# Patient Record
Sex: Female | Born: 1975 | Race: Black or African American | Hispanic: No | Marital: Married | Smoking: Never smoker
Health system: Southern US, Community
[De-identification: ages and names within clinical notes are randomized; demographics above are authoritative.]

## PROBLEM LIST (undated history)

## (undated) DIAGNOSIS — J45909 Unspecified asthma, uncomplicated: Secondary | ICD-10-CM

## (undated) DIAGNOSIS — E039 Hypothyroidism, unspecified: Secondary | ICD-10-CM

## (undated) HISTORY — PX: THYROIDECTOMY: SHX17

## (undated) HISTORY — PX: TUBAL LIGATION: SHX77

## (undated) HISTORY — PX: APPENDECTOMY: SHX54

---

## 2010-09-02 ENCOUNTER — Emergency Department: Payer: Self-pay | Admitting: Unknown Physician Specialty

## 2010-12-27 ENCOUNTER — Emergency Department: Payer: Self-pay | Admitting: Emergency Medicine

## 2012-12-31 ENCOUNTER — Emergency Department: Payer: Self-pay | Admitting: Emergency Medicine

## 2014-01-03 ENCOUNTER — Observation Stay: Payer: Self-pay | Admitting: Surgery

## 2014-01-03 LAB — CBC WITH DIFFERENTIAL/PLATELET
Basophil #: 0.1 10*3/uL (ref 0.0–0.1)
Basophil %: 0.9 %
Eosinophil #: 0.2 10*3/uL (ref 0.0–0.7)
Eosinophil %: 1.7 %
HCT: 36.2 % (ref 35.0–47.0)
HGB: 12 g/dL (ref 12.0–16.0)
Lymphocyte #: 3.3 10*3/uL (ref 1.0–3.6)
Lymphocyte %: 33.8 %
MCH: 30.8 pg (ref 26.0–34.0)
MCHC: 33.2 g/dL (ref 32.0–36.0)
MCV: 93 fL (ref 80–100)
MONOS PCT: 8.4 %
Monocyte #: 0.8 x10 3/mm (ref 0.2–0.9)
NEUTROS ABS: 5.4 10*3/uL (ref 1.4–6.5)
NEUTROS PCT: 55.2 %
PLATELETS: 306 10*3/uL (ref 150–440)
RBC: 3.9 10*6/uL (ref 3.80–5.20)
RDW: 13.2 % (ref 11.5–14.5)
WBC: 9.8 10*3/uL (ref 3.6–11.0)

## 2014-01-03 LAB — URINALYSIS, COMPLETE
BLOOD: NEGATIVE
Bilirubin,UR: NEGATIVE
Glucose,UR: NEGATIVE mg/dL (ref 0–75)
KETONE: NEGATIVE
Leukocyte Esterase: NEGATIVE
Nitrite: NEGATIVE
PH: 7 (ref 4.5–8.0)
Protein: NEGATIVE
RBC,UR: 2 /HPF (ref 0–5)
SPECIFIC GRAVITY: 1.021 (ref 1.003–1.030)
Squamous Epithelial: <1
WBC UR: NONE SEEN /HPF (ref 0–5)

## 2014-01-03 LAB — COMPREHENSIVE METABOLIC PANEL
ALBUMIN: 3.2 g/dL — AB (ref 3.4–5.0)
ANION GAP: 8 (ref 7–16)
Alkaline Phosphatase: 84 U/L
BUN: 14 mg/dL (ref 7–18)
Bilirubin,Total: 0.3 mg/dL (ref 0.2–1.0)
CALCIUM: 8 mg/dL — AB (ref 8.5–10.1)
CHLORIDE: 104 mmol/L (ref 98–107)
CO2: 28 mmol/L (ref 21–32)
Creatinine: 0.99 mg/dL (ref 0.60–1.30)
EGFR (African American): >60
Glucose: 97 mg/dL (ref 65–99)
Osmolality: 280 (ref 275–301)
Potassium: 3.7 mmol/L (ref 3.5–5.1)
SGOT(AST): 20 U/L (ref 15–37)
SGPT (ALT): 8 U/L — ABNORMAL LOW (ref 12–78)
Sodium: 140 mmol/L (ref 136–145)
Total Protein: 7.7 g/dL (ref 6.4–8.2)

## 2014-01-05 LAB — PATHOLOGY REPORT

## 2014-11-10 NOTE — Discharge Summary (Signed)
PATIENT NAME:  Marie LighterFRAZIER, [name]:  [id number] DATE OF BIRTH:  1976/03/28  DATE OF ADMISSION:  01/03/2014 DATE OF DISCHARGE:  01/05/2014  BRIEF HISTORY AND HOSPITAL COURSE: Marie LighterLaketha Gulino is a 39 year old woman admitted through the Emergency Room with clinical presentation and CT evidence consistent with acute appendicitis. After appropriate preoperative preparation and informed consent, she was taken to surgery on the morning of 01/03/2014. She underwent a laparoscopic appendectomy. The procedure was uncomplicated. She did have a bulbous tip to her appendix, but the pathology is not available at the time of discharge. Surgery was otherwise uncomplicated. She had some mild pain control issues following surgery, but this morning, is up, active, tolerating a diet with no complaints. Her wounds look good. There is no sign of any infection. Will discharge her home this morning to be followed in the office in 7 to 10 days' time. Bathing, activity and driving instructions were given to the patient.   DISCHARGE MEDICATIONS: She is to resume her home medications, which include levothyroxine 0.125 mg once a day. She will be discharged home on Norco 5/325 every 4 to 6 hours p.r.n.   FINAL DISCHARGE DIAGNOSIS: Acute appendicitis.   SURGERY: Laparoscopic appendectomy.   ____________________________ Quentin Orealph L. Ely III, MD rle:lb D: 01/05/2014 08:05:59 ET T: 01/05/2014 09:33:20 ET JOB#: [id number]  cc: Quentin Orealph L. Ely III, MD, <Dictator> Quentin OreALPH L ELY MD ELECTRONICALLY SIGNED 01/05/2014 18:18

## 2014-11-10 NOTE — Op Note (Signed)
PATIENT NAME:  [name], Marie MR#:  [id number] DATE OF BIRTH:  April 22, 1976  DATE OF PROCEDURE:  01/03/2014  PREOPERATIVE DIAGNOSIS:  Acute appendicitis.  POSTOPERATIVE DIAGNOSIS: Acute appendicitis with appendiceal mass.   SURGERY: Appendectomy.  ANESTHESIA: General.   SURGEON: Dr. Michela PitcherEly.   OPERATIVE PROCEDURE: With the patient in the supine position and after the induction of appropriate general anesthesia, the patient's abdomen was prepped with ChloraPrep and draped with sterile towels. A transverse midline incision was made in the site of her previous surgery, carried down through the subcutaneous tissue with Bovie electrocautery. A Veress needle was used to cannulate the peritoneal cavity. CO2 was insufflated to appropriate pressure measurements. When approximately 2.5 liters of CO2 were instilled, the Veress needle was withdrawn. An 11 mm medical port was inserted into the peritoneal cavity. Intraperitoneal position was confirmed. CO2 was reinsufflated. The appendix was easily visualized. Appeared to be adherent to the structures surrounding it. The base did not appear to be involved. A mid-epigastric, transverse incision was made, and 11 mm port inserted under direct vision. Left lower quadrant transverse incision was made, then the 12 mm port inserted under direct vision.  The camera was moved to the upper port and dissection carried out through the two lower ports. The appendix appeared to be inflamed and injected with a bulbous tip. Base of the appendix was dissected free of the cecum and the mesoappendix, and divided with a single application of Endo GIA stapling device carrying a blue load. The mesoappendix divided in a similar fashion with a white load. The appendix was captured in Endo Catch apparatus.  Because of the bolus tip, it was extremely difficult to remove. It appeared to be a thickened, mass-like portion at the tip of the appendix consistent with a possible appendiceal mass. The  abdomen was then irrigated and desufflated.  Following closure of the midline and left lower quadrant incisions with suture passer and 0 Vicryl, skin was closed with 5-0 nylon. The area was infiltrated with 0.25% Marcaine for postoperative pain control. Sterile dressings were applied. The patient was returned to the recovery room, having tolerated the procedure well. Sponge, instrument, and needle counts were correct x 2 in the Operating Room.    ____________________________ Quentin Orealph L. Ely III, MD rle:ts D: 01/03/2014 14:48:59 ET T: 01/03/2014 15:31:58 ET JOB#: [id number]  cc: Quentin Orealph L. Ely III, MD, <Dictator> Quentin OreALPH L ELY MD ELECTRONICALLY SIGNED 01/05/2014 18:14

## 2014-11-10 NOTE — H&P (Signed)
   Subjective/Chief Complaint Abdominal pain   History of Present Illness Marie Jimenez is a pleasant 39 yo F with a history of chronic constipation (usually approx 1 bm per week) who presents with 4 days of diffuse abd pain which is worse in the RLQ.  She says that it began suddenly and is worsening.  Last BM today x 2 and was regular.  + nausea.  Worse with movement.  Last PO at 8 pm.  No sick contacts, no unusual ingestions.  Has never had this pain before.  Also feels bloated.   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   Review of Systems:  Subjective/Chief Complaint Abdominal pain, worse RLQ   Fever/Chills No   Cough No   Sputum No   Abdominal Pain Yes   Diarrhea No   Constipation Yes   Nausea/Vomiting Yes   SOB/DOE No   Chest Pain No   Dysuria No   Tolerating Diet No  Nauseated   Physical Exam:  GEN well developed, well nourished, no acute distress   HEENT pink conjunctivae, PERRL, good dentition   RESP normal resp effort  clear BS  no use of accessory muscles   CARD regular rate  no murmur  no thrills  No LE edema  no JVD   ABD positive tenderness  soft  normal BS  hypoactive BS  RLQ tenderness, rebound   SKIN normal to palpation, No rashes, No ulcers   NEURO cranial nerves intact, negative rigidity, negative tremor, follows commands, strength:, motor/sensory function intact   PSYCH A+O to time, place, person, good insight    Assessment/Admission Diagnosis Marie Jimenez is a pleasant 39 yo F who presents with diffuse abd pain, worse in RLQ.  Tender in RLQ.  WBC 10 but neutrophils 55%.  CT shows dilated appendix with ? soft tissue stranding.  Equivocal appendicitis.    Plan Admit for observation, serial abdominal exam, IV fluids.  Will defer to daytime surgeon Dr. Michela PitcherEly regarding possible appendectomy.   Electronic Signatures: Jarvis NewcomerLundquist, Christopher A (MD)  (Signed 17-Jun-15 04:29)  Authored: CHIEF COMPLAINT and HISTORY, PAST MEDICAL/SURGIAL HISTORY, ALLERGIES, FAMILY AND SOCIAL HISTORY, REVIEW OF SYSTEMS, PHYSICAL EXAM, ASSESSMENT AND PLAN   Last Updated: 17-Jun-15 04:29 by Jarvis NewcomerLundquist, Christopher A (MD)

## 2016-07-20 ENCOUNTER — Encounter: Payer: Self-pay | Admitting: Emergency Medicine

## 2016-07-20 DIAGNOSIS — E039 Hypothyroidism, unspecified: Secondary | ICD-10-CM | POA: Insufficient documentation

## 2016-07-20 DIAGNOSIS — Y999 Unspecified external cause status: Secondary | ICD-10-CM | POA: Diagnosis not present

## 2016-07-20 DIAGNOSIS — W268XXA Contact with other sharp object(s), not elsewhere classified, initial encounter: Secondary | ICD-10-CM | POA: Diagnosis not present

## 2016-07-20 DIAGNOSIS — S61211A Laceration without foreign body of left index finger without damage to nail, initial encounter: Secondary | ICD-10-CM | POA: Insufficient documentation

## 2016-07-20 DIAGNOSIS — Y9389 Activity, other specified: Secondary | ICD-10-CM | POA: Diagnosis not present

## 2016-07-20 DIAGNOSIS — Y929 Unspecified place or not applicable: Secondary | ICD-10-CM | POA: Insufficient documentation

## 2016-07-20 NOTE — ED Triage Notes (Signed)
Pt presents to ED with c/o laceration to left index finger. Pt states she cut it this morning around 9am while cutting ribs. Pt states she attempted to apply pressure to the finger and has kept it bandaged all day but the bleeding has continued. Does not take a blood thinner or aspirin daily.

## 2016-07-21 ENCOUNTER — Emergency Department
Admission: EM | Admit: 2016-07-21 | Discharge: 2016-07-21 | Disposition: A | Discharge status: Home / Self Care | Attending: Emergency Medicine | Admitting: Emergency Medicine

## 2016-07-21 DIAGNOSIS — S61211A Laceration without foreign body of left index finger without damage to nail, initial encounter: Secondary | ICD-10-CM

## 2016-07-21 HISTORY — DX: Hypothyroidism, unspecified: E03.9

## 2016-07-21 NOTE — ED Notes (Signed)
Xeroform and dressing applied to LEFT index finger, Pt tolerated well.

## 2016-07-21 NOTE — Discharge Instructions (Signed)
Keep wound clean and dry.  Return to the ER for worsening symptoms, increased redness/swelling, purulent discharge or other concerns. 

## 2016-07-21 NOTE — ED Provider Notes (Signed)
[facility] Emergency Department Provider Note   ____________________________________________   First MD Initiated Contact with Patient 07/21/16 [id number]     (approximate)  I have reviewed the triage vital signs and the nursing notes.   HISTORY  Chief Complaint Laceration    HPI Marie Jimenez is a 41 y.o. female who presents to the ED from home with a chief complaint of left index finger laceration. Patient reports she cut her finger yesterday morning at 9 AM while cutting ribs. Tetanus is up-to-date. Presents to the ED due to persistent and intermittent bleeding throughout the day and night. Voices no other medical complaints.   Past Medical History:  Diagnosis Date  . Hypothyroid     There are no active problems to display for this patient.   Past Surgical History:  Procedure Laterality Date  . THYROIDECTOMY    . TUBAL LIGATION      Prior to Admission medications   Not on File    Allergies Amoxicillin and Shellfish allergy  No family history on file.  Social History Social History  Substance Use Topics  . Smoking status: Never Smoker  . Smokeless tobacco: Never Used  . Alcohol use Yes    Review of Systems  Constitutional: No fever/chills. Eyes: No visual changes. ENT: No sore throat. Cardiovascular: Denies chest pain. Respiratory: Denies shortness of breath. Gastrointestinal: No abdominal pain.  No nausea, no vomiting.  No diarrhea.  No constipation. Genitourinary: Negative for dysuria. Musculoskeletal: Positive for left index finger laceration. Negative for back pain. Skin: Negative for rash. Neurological: Negative for headaches, focal weakness or numbness.  10-point ROS otherwise negative.  ____________________________________________   PHYSICAL EXAM:  VITAL SIGNS: ED Triage Vitals  Enc Vitals Group     BP 07/20/16 2326 128/75     Pulse Rate 07/20/16 2326 78     Resp 07/20/16 2326 18     Temp 07/20/16 2326  98.2 F (36.8 C)     Temp Source 07/20/16 2326 Oral     SpO2 07/20/16 2326 100 %     Weight 07/20/16 2326 200 lb (90.7 kg)     Height 07/20/16 2326 5\' 10"  (1.778 m)     Head Circumference --      Peak Flow --      Pain Score 07/20/16 2327 7     Pain Loc --      Pain Edu? --      Excl. in GC? --     Constitutional: Alert and oriented. Well appearing and in no acute distress. Eyes: Conjunctivae are normal. PERRL. EOMI. Head: Atraumatic. Nose: No congestion/rhinnorhea. Mouth/Throat: Mucous membranes are moist.  Oropharynx non-erythematous. Neck: No stridor.   Cardiovascular: Normal rate, regular rhythm. Grossly normal heart sounds.  Good peripheral circulation. Respiratory: Normal respiratory effort.  No retractions. Lungs CTAB. Gastrointestinal: Soft and nontender. No distention. No abdominal bruits. No CVA tenderness. Musculoskeletal:  Left dorsal index finger with superficial paper cut-like laceration to proximal finger above the IP joints. There is no active bleeding. Wound is well approximated. 2+ radial pulses. Brisk, less than 5 second capillary refill. Neurologic:  Normal speech and language. No gross focal neurologic deficits are appreciated. No gait instability. Skin:  Skin is warm, dry and intact. No rash noted. Psychiatric: Mood and affect are normal. Speech and behavior are normal.  ____________________________________________   LABS (all labs ordered are listed, but only abnormal results are displayed)  Labs Reviewed - No data to display ____________________________________________  EKG  None  ____________________________________________  RADIOLOGY  None ____________________________________________   PROCEDURES  Procedure(s) performed: None  Procedures  Critical Care performed: No  ____________________________________________   INITIAL IMPRESSION / ASSESSMENT AND PLAN / ED COURSE  Pertinent labs & imaging results that were available during my  care of the patient were reviewed by me and considered in my medical decision making (see chart for details).  41 year old female who presents with superficial, nonbleeding laceration to his dorsal left index finger. Xeroform gauze and dressing applied by nurse. Strict return precautions given. Patient verbalizes understanding and agrees with plan of care.  Clinical Course      ____________________________________________   FINAL CLINICAL IMPRESSION(S) / ED DIAGNOSES  Final diagnoses:  Laceration of left index finger without foreign body without damage to nail, initial encounter      NEW MEDICATIONS STARTED DURING THIS VISIT:  New Prescriptions   No medications on file     Note:  This document was prepared using Dragon voice recognition software and may include unintentional dictation errors.    Irean HongJade J Hue Steveson, MD 07/21/16 [phone]

## 2016-07-21 NOTE — ED Notes (Signed)

## 2018-05-03 ENCOUNTER — Emergency Department (HOSPITAL_COMMUNITY)
Admission: EM | Admit: 2018-05-03 | Discharge: 2018-05-03 | Disposition: A | Discharge status: Home / Self Care | Attending: Emergency Medicine | Admitting: Emergency Medicine

## 2018-05-03 ENCOUNTER — Other Ambulatory Visit: Payer: Self-pay

## 2018-05-03 ENCOUNTER — Emergency Department (HOSPITAL_COMMUNITY)

## 2018-05-03 ENCOUNTER — Encounter (HOSPITAL_COMMUNITY): Payer: Self-pay | Admitting: *Deleted

## 2018-05-03 DIAGNOSIS — R55 Syncope and collapse: Secondary | ICD-10-CM

## 2018-05-03 DIAGNOSIS — R079 Chest pain, unspecified: Secondary | ICD-10-CM | POA: Diagnosis not present

## 2018-05-03 DIAGNOSIS — R002 Palpitations: Secondary | ICD-10-CM

## 2018-05-03 HISTORY — DX: Unspecified asthma, uncomplicated: J45.909

## 2018-05-03 LAB — CBC
HEMATOCRIT: 36.3 % (ref 36.0–46.0)
HEMOGLOBIN: 11.8 g/dL — AB (ref 12.0–15.0)
MCH: 30.5 pg (ref 26.0–34.0)
MCHC: 32.5 g/dL (ref 30.0–36.0)
MCV: 93.8 fL (ref 80.0–100.0)
NRBC: 0 % (ref 0.0–0.2)
Platelets: 262 10*3/uL (ref 150–400)
RBC: 3.87 MIL/uL (ref 3.87–5.11)
RDW: 12.3 % (ref 11.5–15.5)
WBC: 4.1 10*3/uL (ref 4.0–10.5)

## 2018-05-03 LAB — I-STAT BETA HCG BLOOD, ED (MC, WL, AP ONLY)

## 2018-05-03 LAB — BASIC METABOLIC PANEL
ANION GAP: 11 (ref 5–15)
BUN: 10 mg/dL (ref 6–20)
CHLORIDE: 101 mmol/L (ref 98–111)
CO2: 24 mmol/L (ref 22–32)
CREATININE: 0.91 mg/dL (ref 0.44–1.00)
Calcium: 9.7 mg/dL (ref 8.9–10.3)
GFR calc non Af Amer: >60 mL/min (ref >60)
Glucose, Bld: 95 mg/dL (ref 70–99)
POTASSIUM: 3.8 mmol/L (ref 3.5–5.1)
SODIUM: 136 mmol/L (ref 135–145)

## 2018-05-03 LAB — I-STAT TROPONIN, ED: Troponin i, poc: 0 ng/mL (ref 0.00–0.08)

## 2018-05-03 LAB — D-DIMER, QUANTITATIVE: D-Dimer, Quant: 0.27 ug/mL-FEU (ref 0.00–0.50)

## 2018-05-03 MED ORDER — LACTATED RINGERS IV BOLUS
1000.0000 mL | Freq: Once | INTRAVENOUS | Status: AC
  Administered 2018-05-03: 1000 mL via INTRAVENOUS

## 2018-05-03 MED ORDER — LORAZEPAM 2 MG/ML IJ SOLN
1.0000 mg | Freq: Once | INTRAMUSCULAR | Status: AC
  Administered 2018-05-03: 1 mg via INTRAVENOUS
  Filled 2018-05-03: qty 1

## 2018-05-03 NOTE — ED Triage Notes (Signed)
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 Pt used two puffs of her inhaler without improvement. Pt tachypneic, lung sounds are clear. Hx of hypothyroidism and asthma. Denies recent travel at present.

## 2018-05-03 NOTE — ED Notes (Signed)
Patient transported to X-ray 

## 2018-05-03 NOTE — ED Provider Notes (Signed)
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 Used inhaler 2 puffs, with no relief. States currently having left sided chest pain and sob.  ROS: SOB, chest pain, diziness  Physical Exam:   Gen: No distress  Neuro: Awake and Alert  Skin: Warm    Focused Exam:anxious appearing, hyperventilating. Breathing normal when distracted. Lungs clear to ausculation, regular HR and rhythm.   Vitals:   05/03/18 1916  BP: 128/85  Pulse: 68  Resp: 18  Temp: 98.6 F (37 C)  TempSrc: Oral  SpO2: 100%    ECG looks OK. Will get labs, CXR. Vitals are normal.   Initiation of care has begun. The patient has been counseled on the process, plan, and necessity for staying for the completion/evaluation, and the remainder of the medical screening examination    Jaynie Crumble, PA-C 05/03/18 1931    Little, Ambrose Finland, MD 05/04/18 1421

## 2018-05-03 NOTE — ED Notes (Signed)
Pt ambulatory to bathroom with no reported issues. 

## 2018-05-03 NOTE — ED Notes (Signed)
Reviewed d/c instructions with pt, who verbalized understanding and had no outstanding questions. Pt departed in NAD, refused use of wheelchair.   

## 2018-07-22 ENCOUNTER — Other Ambulatory Visit: Payer: Self-pay

## 2018-07-22 ENCOUNTER — Emergency Department
Admission: EM | Admit: 2018-07-22 | Discharge: 2018-07-23 | Disposition: A | Discharge status: Home / Self Care | Attending: Emergency Medicine | Admitting: Emergency Medicine

## 2018-07-22 DIAGNOSIS — L509 Urticaria, unspecified: Secondary | ICD-10-CM

## 2018-07-22 DIAGNOSIS — E039 Hypothyroidism, unspecified: Secondary | ICD-10-CM | POA: Insufficient documentation

## 2018-07-22 DIAGNOSIS — J45909 Unspecified asthma, uncomplicated: Secondary | ICD-10-CM | POA: Insufficient documentation

## 2018-07-22 DIAGNOSIS — T7840XA Allergy, unspecified, initial encounter: Secondary | ICD-10-CM | POA: Diagnosis not present

## 2018-07-22 DIAGNOSIS — Z79899 Other long term (current) drug therapy: Secondary | ICD-10-CM | POA: Diagnosis not present

## 2018-07-22 LAB — CBC WITH DIFFERENTIAL/PLATELET
Abs Immature Granulocytes: 0.03 10*3/uL (ref 0.00–0.07)
BASOS ABS: 0 10*3/uL (ref 0.0–0.1)
BASOS PCT: 0 %
EOS ABS: 0.1 10*3/uL (ref 0.0–0.5)
EOS PCT: 2 %
HCT: 37.8 % (ref 36.0–46.0)
Hemoglobin: 12.4 g/dL (ref 12.0–15.0)
Immature Granulocytes: 0 %
LYMPHS PCT: 51 %
Lymphs Abs: 3.7 10*3/uL (ref 0.7–4.0)
MCH: 30.4 pg (ref 26.0–34.0)
MCHC: 32.8 g/dL (ref 30.0–36.0)
MCV: 92.6 fL (ref 80.0–100.0)
MONO ABS: 0.6 10*3/uL (ref 0.1–1.0)
Monocytes Relative: 8 %
NRBC: 0 % (ref 0.0–0.2)
Neutro Abs: 2.9 10*3/uL (ref 1.7–7.7)
Neutrophils Relative %: 39 %
PLATELETS: 332 10*3/uL (ref 150–400)
RBC: 4.08 MIL/uL (ref 3.87–5.11)
RDW: 12.3 % (ref 11.5–15.5)
WBC: 7.5 10*3/uL (ref 4.0–10.5)

## 2018-07-22 LAB — COMPREHENSIVE METABOLIC PANEL
ALBUMIN: 4.3 g/dL (ref 3.5–5.0)
ALT: 12 U/L (ref 0–44)
ANION GAP: 7 (ref 5–15)
AST: 21 U/L (ref 15–41)
Alkaline Phosphatase: 49 U/L (ref 38–126)
BUN: 21 mg/dL — ABNORMAL HIGH (ref 6–20)
CALCIUM: 8.3 mg/dL — AB (ref 8.9–10.3)
CO2: 24 mmol/L (ref 22–32)
Chloride: 107 mmol/L (ref 98–111)
Creatinine, Ser: 0.91 mg/dL (ref 0.44–1.00)
GFR calc non Af Amer: >60 mL/min (ref >60)
GLUCOSE: 94 mg/dL (ref 70–99)
POTASSIUM: 3.2 mmol/L — AB (ref 3.5–5.1)
SODIUM: 138 mmol/L (ref 135–145)
TOTAL PROTEIN: 7.7 g/dL (ref 6.5–8.1)
Total Bilirubin: 0.5 mg/dL (ref 0.3–1.2)

## 2018-07-22 MED ORDER — EPINEPHRINE 0.3 MG/0.3ML IJ SOAJ: 0.3000 mg | Freq: Once | INTRAMUSCULAR | 1 refills | Status: AC

## 2018-07-22 MED ORDER — POTASSIUM CHLORIDE CRYS ER 20 MEQ PO TBCR
40.0000 meq | EXTENDED_RELEASE_TABLET | Freq: Once | ORAL | Status: AC
  Administered 2018-07-22: 40 meq via ORAL
  Filled 2018-07-22: qty 2

## 2018-07-22 MED ORDER — PREDNISONE 10 MG (21) PO TBPK: ORAL_TABLET | ORAL | 0 refills | Status: DC

## 2018-07-22 MED ORDER — HYDROXYZINE HCL 25 MG PO TABS
25.0000 mg | ORAL_TABLET | Freq: Once | ORAL | Status: AC
  Administered 2018-07-22: 25 mg via ORAL
  Filled 2018-07-22: qty 1

## 2018-07-22 MED ORDER — FAMOTIDINE IN NACL 20-0.9 MG/50ML-% IV SOLN
20.0000 mg | Freq: Once | INTRAVENOUS | Status: AC
  Administered 2018-07-22: 20 mg via INTRAVENOUS
  Filled 2018-07-22: qty 50

## 2018-07-22 MED ORDER — METHYLPREDNISOLONE SODIUM SUCC 125 MG IJ SOLR
125.0000 mg | Freq: Once | INTRAMUSCULAR | Status: AC
  Administered 2018-07-22: 125 mg via INTRAVENOUS
  Filled 2018-07-22: qty 2

## 2018-07-22 MED ORDER — DIPHENHYDRAMINE HCL 50 MG/ML IJ SOLN
50.0000 mg | Freq: Once | INTRAMUSCULAR | Status: AC
  Administered 2018-07-22: 50 mg via INTRAVENOUS
  Filled 2018-07-22: qty 1

## 2018-07-22 NOTE — ED Triage Notes (Signed)
Pt in with co hives all over states has had intermittent symptoms for a week. Went to urgent care a few days ago and got IM steroids and benadryl. States today feels worse, now feels like throat is "tight".

## 2018-07-22 NOTE — Discharge Instructions (Signed)
Please seek medical attention for any high fevers, chest pain, shortness of breath, change in behavior, persistent vomiting, bloody stool or any other new or concerning symptoms.  

## 2018-07-22 NOTE — ED Provider Notes (Signed)
[facility] Emergency Department Provider Note    ____________________________________________   I have reviewed the triage vital signs and the nursing notes.   HISTORY  Chief Complaint Allergic Reaction   History limited by: Not Limited   HPI Marie Jimenez is a 43 y.o. female who presents to the emergency department today because of concerns for continued and somewhat recurrent hives.  Patient states she first had a hives 4 days ago.  She is unsure what her allergen exposure might have been but she does describe diffuse hives and itchiness.  She went to walk-in clinic at that time was given a shot of steroids and Benadryl and stated that the next day she did feel better.  The hives however returned.  She states today she is also feeling a slight bit of throat tightness when she goes to swallow.  She has not had any nausea or vomiting.  She states she has a history of asthma.  She denies any recent illnesses or fevers.   Per medical record review patient has a history of asthma.  Past Medical History:  Diagnosis Date  . Asthma   . Hypothyroid     There are no active problems to display for this patient.   Past Surgical History:  Procedure Laterality Date  . THYROIDECTOMY    . TUBAL LIGATION      Prior to Admission medications   Medication Sig Start Date End Date Taking? Authorizing Provider  levothyroxine (SYNTHROID, LEVOTHROID) 112 MCG tablet Take 112 mcg by mouth daily. 04/19/18   [provider]  phentermine (ADIPEX-P) 37.5 MG tablet Take 37.5 mg by mouth daily. 02/15/18   [provider]    Allergies Amoxicillin and Shellfish allergy  No family history on file.  Social History Social History   Tobacco Use  . Smoking status: Never Smoker  . Smokeless tobacco: Never Used  Substance Use Topics  . Alcohol use: Yes  . Drug use: No    Review of Systems Constitutional: No fever/chills Eyes: No visual changes. ENT:  Positive for slight tightness with swallowing.  Cardiovascular: Denies chest pain. Respiratory: Denies shortness of breath. Gastrointestinal: No abdominal pain.  No nausea, no vomiting.  No diarrhea.   Genitourinary: Negative for dysuria. Musculoskeletal: Negative for back pain. Skin: Positive for hives. Neurological: Negative for headaches, focal weakness or numbness.  ____________________________________________   PHYSICAL EXAM:  VITAL SIGNS: ED Triage Vitals [07/22/18 2203]  Enc Vitals Group     BP (!) 147/79     Pulse Rate 76     Resp 20     Temp 98.2 F (36.8 C)     Temp Source Oral     SpO2 100 %     Weight 215 lb (97.5 kg)     Height      Head Circumference      Peak Flow      Pain Score 0   Constitutional: Alert and oriented.  Eyes: Conjunctivae are normal.  ENT      Head: Normocephalic and atraumatic.      Nose: No congestion/rhinnorhea.      Mouth/Throat: Mucous membranes are moist.      Neck: No stridor. Hematological/Lymphatic/Immunilogical: No cervical lymphadenopathy. Cardiovascular: Normal rate, regular rhythm.  No murmurs, rubs, or gallops.  Respiratory: Normal respiratory effort without tachypnea nor retractions. Breath sounds are clear and equal bilaterally. No wheezes/rales/rhonchi. Gastrointestinal: Soft and non tender. No rebound. No guarding.  Genitourinary: Deferred Musculoskeletal: Normal range of motion in all  extremities. No lower extremity edema. Neurologic:  Normal speech and language. No gross focal neurologic deficits are appreciated.  Skin:  Small amount of hives on forearms.  Psychiatric: Mood and affect are normal. Speech and behavior are normal. Patient exhibits appropriate insight and judgment.  ____________________________________________    LABS (pertinent positives/negatives)  CBC wbc 7.5, hgb 12.4, plt 332 CMP na 138, k 3.2, glu 94, cr  0.91  ____________________________________________   EKG  None  ____________________________________________    RADIOLOGY  None   ____________________________________________   PROCEDURES  Procedures  ____________________________________________   INITIAL IMPRESSION / ASSESSMENT AND PLAN / ED COURSE  Pertinent labs & imaging results that were available during my care of the patient were reviewed by me and considered in my medical decision making (see chart for details).   Patient presented to the emergency department today because of concerns for continued hives and itchiness.  On exam patient does have some hives of the forearms.  Patient no respiratory distress.  Patient will be given IV Benadryl and Solu-Medrol.  Will check some blood work to make sure that we do not see any concerning findings.  Blood work shows very minimally low potassium but otherwise unremarkable.  Patient still has some hives so will add on Pepcid as well as Atarax.   ____________________________________________   FINAL CLINICAL IMPRESSION(S) / ED DIAGNOSES  Final diagnoses:  Allergic reaction, initial encounter  Hives     Note: This dictation was prepared with Dragon dictation. Any transcriptional errors that result from this process are unintentional     Phineas SemenGoodman, Choua Ikner, MD 07/22/18 2330

## 2018-07-23 NOTE — ED Notes (Signed)
Peripheral IV discontinued. Catheter intact. No signs of infiltration or redness. Gauze applied to IV site.   Discharge instructions reviewed with patient. Questions fielded by this RN. Patient verbalizes understanding of instructions. Patient discharged home in stable condition per [place]. No acute distress noted at time of discharge.   

## 2018-08-02 ENCOUNTER — Ambulatory Visit (INDEPENDENT_AMBULATORY_CARE_PROVIDER_SITE_OTHER): Admitting: Allergy and Immunology

## 2018-08-02 ENCOUNTER — Encounter: Payer: Self-pay | Admitting: Allergy and Immunology

## 2018-08-02 VITALS — BP 124/80 | HR 88 | Temp 98.9°F | Resp 18 | Ht 70.0 in | Wt 225.0 lb

## 2018-08-02 DIAGNOSIS — T7800XD Anaphylactic reaction due to unspecified food, subsequent encounter: Secondary | ICD-10-CM

## 2018-08-02 DIAGNOSIS — L500 Allergic urticaria: Secondary | ICD-10-CM

## 2018-08-02 DIAGNOSIS — T7800XA Anaphylactic reaction due to unspecified food, initial encounter: Secondary | ICD-10-CM | POA: Insufficient documentation

## 2018-08-02 DIAGNOSIS — J4520 Mild intermittent asthma, uncomplicated: Secondary | ICD-10-CM | POA: Insufficient documentation

## 2018-08-02 DIAGNOSIS — K2970 Gastritis, unspecified, without bleeding: Secondary | ICD-10-CM | POA: Diagnosis not present

## 2018-08-02 DIAGNOSIS — L501 Idiopathic urticaria: Secondary | ICD-10-CM | POA: Insufficient documentation

## 2018-08-02 MED ORDER — EPINEPHRINE 0.3 MG/0.3ML IJ SOAJ: 0.3000 mg | Freq: Once | INTRAMUSCULAR | 2 refills | Status: DC | PRN

## 2018-08-02 MED ORDER — MONTELUKAST SODIUM 10 MG PO TABS: 10.0000 mg | ORAL_TABLET | Freq: Every day | ORAL | 5 refills | Status: DC

## 2018-08-02 MED ORDER — FAMOTIDINE 20 MG PO TABS: 20.0000 mg | ORAL_TABLET | Freq: Two times a day (BID) | ORAL | 5 refills | Status: DC

## 2018-08-02 NOTE — Assessment & Plan Note (Signed)
   Continue albuterol HFA, 1 to 2 inhalations every 4-6 hours as needed and 15 minutes prior to exercise. 

## 2018-08-02 NOTE — Assessment & Plan Note (Addendum)
Unclear etiology. Skin tests to select food allergens were negative today. NSAIDs and emotional stress commonly exacerbate urticaria but are not the underlying etiology in this case. Physical urticarias are negative by history (i.e. pressure-induced, temperature, vibration, solar, etc.).  There are no concomitant symptoms concerning for anaphylaxis. We will rule out other potential etiologies with labs. For symptom relief, patient is to take oral antihistamines as directed.  The following labs have been ordered: FCeRI antibody, anti-thyroglobulin antibody, thyroid peroxidase antibody, tryptase, urea breath test, ESR, ANA, and galactose-alpha-1,3-galactose IgE level. CBC and CMP were recently checked so those labs will not be rechecked at this time.  The patient will be called with further recommendations after lab results have returned.  Instructions have been discussed and provided for H1/H2 receptor blockade with titration to find lowest effective dose.  A prescription has been provided for levocetirizine, 5 mg daily as needed.  A prescription has been provided for famotidine (Pepcid) 20 mg twice daily.  A prescription has been provided for montelukast 10 mg daily at bedtime.  Should there be a significant increase or change in symptoms, a journal is to be kept recording any foods eaten, beverages consumed, medications taken within a 6 hour period prior to the onset of symptoms, as well as record activities being performed, and environmental conditions. For any symptoms concerning for anaphylaxis, 911 is to be called immediately.

## 2018-08-02 NOTE — Progress Notes (Signed)
New Patient Note  RE: Marie Jimenez MRN: [id number] DOB: October 31, 1975 Date of Office Visit: 08/02/2018  Referring provider: Katherina Mires, MD Primary care provider: Katherina Mires, MD  Chief Complaint: Urticaria and Allergic Reaction   History of present illness: Marie Jimenez is a 43 y.o. female seen today in consultation requested by Suzanna Obey, MD.  Over the past 4 months, Marie Jimenez has experienced recurrent episodes of hives. The hives have appeared at different times over her entire body.  The lesions are described as erythematous, raised, and pruritic.  Individual hives last less than 24 hours without leaving residual pigmentation or bruising. She denies concomitant angioedema, cardiopulmonary symptoms and GI symptoms. She has not experienced unexpected weight loss, recurrent fevers or drenching night sweats. No specific medication, food, skin care product, detergent, soap, or other environmental triggers have been identified. The symptoms do not seem to correlate with NSAIDs use or emotional stress. She did not have symptoms consistent with a respiratory tract infection at the time of symptom onset. Marie Jimenez has tried to control symptoms with systemic steroids and OTC antihistamines which have offered adequate temporary relief.  However, the hives seem to return when she discontinues prednisone.  She has been evaluated and treated in the emergency department for these symptoms. Skin biopsy has not been performed. Recently, she has been experiencing hives a few times a week.  She reports that since childhood she has experienced generalized pruritus with vigorous exercise and sweating.  She does not believe that she developed urticaria during those times however. She reports that when she was 71 or 43 years old she consumed shellfish and fish and had a severe anaphylactic reaction requiring multiple day hospitalization.  She has carefully avoided shellfish and fish since that time and has access  to epinephrine autoinjectors. The patient states that she was diagnosed with asthma approximately 20 years ago.  She typically only needs albuterol rescue with exercise.  Assessment and plan: Recurrent urticaria Unclear etiology. Skin tests to select food allergens were negative today. NSAIDs and emotional stress commonly exacerbate urticaria but are not the underlying etiology in this case. Physical urticarias are negative by history (i.e. pressure-induced, temperature, vibration, solar, etc.).  There are no concomitant symptoms concerning for anaphylaxis. We will rule out other potential etiologies with labs. For symptom relief, patient is to take oral antihistamines as directed.  The following labs have been ordered: FCeRI antibody, anti-thyroglobulin antibody, thyroid peroxidase antibody, tryptase, urea breath test, ESR, ANA, and galactose-alpha-1,3-galactose IgE level. CBC and CMP were recently checked so those labs will not be rechecked at this time.  The patient will be called with further recommendations after lab results have returned.  Instructions have been discussed and provided for H1/H2 receptor blockade with titration to find lowest effective dose.  A prescription has been provided for levocetirizine, 5 mg daily as needed.  A prescription has been provided for famotidine (Pepcid) 20 mg twice daily.  A prescription has been provided for montelukast 10 mg daily at bedtime.  Should there be a significant increase or change in symptoms, a journal is to be kept recording any foods eaten, beverages consumed, medications taken within a 6 hour period prior to the onset of symptoms, as well as record activities being performed, and environmental conditions. For any symptoms concerning for anaphylaxis, 911 is to be called immediately.  Food allergy The patient's history suggests food allergy and positive skin test results today confirm this diagnosis.  Food allergen skin test revealed  reactivity to shellfish.  However, skin tests were negative to fish despite a positive histamine control.  Meticulous avoidance of shellfish as discussed.  A refill prescription has been provided for epinephrine auto-injector 2 pack along with instructions for proper administration.  A food allergy action plan has been provided and discussed.  Medic Alert identification is recommended.  A laboratory order form has been provided for serum specific IgE against fish panel.  Until fish allergy has been ruled out, continue avoidance of fish, in addition to shellfish, and have access to epinephrine autoinjectors.  Mild intermittent asthma  Continue albuterol HFA, 1 to 2 inhalations every 4-6 hours as needed and 15 minutes prior to exercise.   Meds ordered this encounter  Medications  . famotidine (PEPCID) 20 MG tablet    Sig: Take 1 tablet (20 mg total) by mouth 2 (two) times daily.    Dispense:  60 tablet    Refill:  5  . montelukast (SINGULAIR) 10 MG tablet    Sig: Take 1 tablet (10 mg total) by mouth at bedtime.    Dispense:  30 tablet    Refill:  5  . EPINEPHrine (EPIPEN 2-PAK) 0.3 mg/0.3 mL IJ SOAJ injection    Sig: Inject 0.3 mLs (0.3 mg total) into the muscle once as needed for up to 1 dose.    Dispense:  2 Device    Refill:  2    Diagnostics: Environmental skin testing: Negative despite a positive histamine control. Food allergen skin testing: Reactive to shrimp, crab, lobster, oyster, and scallops.    Physical examination: Blood pressure 124/80, pulse 88, temperature 98.9 F (37.2 C), temperature source Oral, resp. rate 18, height 5' 10"  (1.778 m), weight 225 lb (102.1 kg), last menstrual period 07/03/2018, SpO2 98 %.  General: Alert, interactive, in no acute distress. HEENT: TMs pearly gray, turbinates moderately edematous without discharge, post-pharynx unremarkable. Neck: Supple without lymphadenopathy. Lungs: Clear to auscultation without wheezing, rhonchi or  rales. CV: Normal S1, S2 without murmurs. Abdomen: Nondistended, nontender. Skin: Warm and dry, without lesions or rashes. Extremities:  No clubbing, cyanosis or edema. Neuro:   Grossly intact.  Review of systems:  Review of systems negative except as noted in HPI / PMHx or noted below: Review of Systems  Constitutional: Negative.   HENT: Negative.   Eyes: Negative.   Respiratory: Negative.   Cardiovascular: Negative.   Gastrointestinal: Negative.   Genitourinary: Negative.   Musculoskeletal: Negative.   Skin: Negative.   Neurological: Negative.   Endo/Heme/Allergies: Negative.   Psychiatric/Behavioral: Negative.     Past medical history:  Past Medical History:  Diagnosis Date  . Asthma   . Hypothyroid     Past surgical history:  Past Surgical History:  Procedure Laterality Date  . APPENDECTOMY    . THYROIDECTOMY    . TUBAL LIGATION      Family history: History reviewed. No pertinent family history.  Social history: Social History   Socioeconomic History  . Marital status: Married    Spouse name: Not on file  . Number of children: Not on file  . Years of education: Not on file  . Highest education level: Not on file  Occupational History  . Not on file  Social Needs  . Financial resource strain: Not on file  . Food insecurity:    Worry: Not on file    Inability: Not on file  . Transportation needs:    Medical: Not on file    Non-medical: Not on file  Tobacco Use  . Smoking status: Never Smoker  . Smokeless tobacco: Never Used  Substance and Sexual Activity  . Alcohol use: Yes  . Drug use: No  . Sexual activity: Not on file  Lifestyle  . Physical activity:    Days per week: Not on file    Minutes per session: Not on file  . Stress: Not on file  Relationships  . Social connections:    Talks on phone: Not on file    Gets together: Not on file    Attends religious service: Not on file    Active member of club or organization: Not on file     Attends meetings of clubs or organizations: Not on file    Relationship status: Not on file  . Intimate partner violence:    Fear of current or ex partner: Not on file    Emotionally abused: Not on file    Physically abused: Not on file    Forced sexual activity: Not on file  Other Topics Concern  . Not on file  Social History Narrative  . Not on file   Environmental History: The patient lives in a 42 year old house with carpeting in the bedroom and central air and window air conditioning units.  There is no known mold/water damage in the home.  She is a never smoker.  There are no pets in the home.  Allergies as of 08/02/2018      Reactions   Amoxicillin Swelling   Shellfish Allergy Swelling      Medication List       Accurate as of August 02, 2018  4:18 PM. Always use your most recent med list.        EPINEPHrine 0.3 mg/0.3 mL Soaj injection Commonly known as:  EPIPEN 2-PAK Inject 0.3 mLs (0.3 mg total) into the muscle once as needed for up to 1 dose.   famotidine 20 MG tablet Commonly known as:  PEPCID Take 1 tablet (20 mg total) by mouth 2 (two) times daily.   levothyroxine 112 MCG tablet Commonly known as:  SYNTHROID, LEVOTHROID Take 112 mcg by mouth daily.   montelukast 10 MG tablet Commonly known as:  SINGULAIR Take 1 tablet (10 mg total) by mouth at bedtime.       Known medication allergies: Allergies  Allergen Reactions  . Amoxicillin Swelling  . Shellfish Allergy Swelling    I appreciate the opportunity to take part in Marie Jimenez's care. Please do not hesitate to contact me with questions.  Sincerely,   R. Edgar Frisk, MD

## 2018-08-02 NOTE — Assessment & Plan Note (Addendum)
The patient's history suggests food allergy and positive skin test results today confirm this diagnosis.  Food allergen skin test revealed reactivity to shellfish.  However, skin tests were negative to fish despite a positive histamine control.  Meticulous avoidance of shellfish as discussed.  A refill prescription has been provided for epinephrine auto-injector 2 pack along with instructions for proper administration.  A food allergy action plan has been provided and discussed.  Medic Alert identification is recommended.  A laboratory order form has been provided for serum specific IgE against fish panel.  Until fish allergy has been ruled out, continue avoidance of fish, in addition to shellfish, and have access to epinephrine autoinjectors.

## 2018-08-02 NOTE — Patient Instructions (Addendum)
Recurrent urticaria Unclear etiology. Skin tests to select food allergens were negative today. NSAIDs and emotional stress commonly exacerbate urticaria but are not the underlying etiology in this case. Physical urticarias are negative by history (i.e. pressure-induced, temperature, vibration, solar, etc.).  There are no concomitant symptoms concerning for anaphylaxis. We will rule out other potential etiologies with labs. For symptom relief, patient is to take oral antihistamines as directed.  The following labs have been ordered: FCeRI antibody, anti-thyroglobulin antibody, thyroid peroxidase antibody, tryptase, urea breath test, ESR, ANA, and galactose-alpha-1,3-galactose IgE level. CBC and CMP were recently checked so those labs will not be rechecked at this time.  The patient will be called with further recommendations after lab results have returned.  Instructions have been discussed and provided for H1/H2 receptor blockade with titration to find lowest effective dose.  A prescription has been provided for levocetirizine, 5 mg daily as needed.  A prescription has been provided for famotidine (Pepcid) 20 mg twice daily.  A prescription has been provided for montelukast 10 mg daily at bedtime.  Should there be a significant increase or change in symptoms, a journal is to be kept recording any foods eaten, beverages consumed, medications taken within a 6 hour period prior to the onset of symptoms, as well as record activities being performed, and environmental conditions. For any symptoms concerning for anaphylaxis, 911 is to be called immediately.  Food allergy The patient's history suggests food allergy and positive skin test results today confirm this diagnosis.  Food allergen skin test revealed reactivity to shellfish.  However, skin tests were negative to fish despite a positive histamine control.  Meticulous avoidance of shellfish as discussed.  A refill prescription has been provided  for epinephrine auto-injector 2 pack along with instructions for proper administration.  A food allergy action plan has been provided and discussed.  Medic Alert identification is recommended.  A laboratory order form has been provided for serum specific IgE against fish panel.  Until fish allergy has been ruled out, continue avoidance of fish, in addition to shellfish, and have access to epinephrine autoinjectors.  Mild intermittent asthma  Continue albuterol HFA, 1 to 2 inhalations every 4-6 hours as needed and 15 minutes prior to exercise.   When lab results have returned the patient will be called with further recommendations and follow up instructions.   Urticaria (Hives)  . Levocetirizine (Xyzal) 5 mg twice a day and famotidine (Pepcid) 20 mg twice a day. If no symptoms for 7-14 days then decrease to. . Levocetirizine (Xyzal) 5 mg twice a day and famotidine (Pepcid) 20 mg once a day.  If no symptoms for 7-14 days then decrease to. . Levocetirizine (Xyzal) 5 mg twice a day.  If no symptoms for 7-14 days then decrease to. . Levocetirizine (Xyzal) 5 mg once a day.  May use Benadryl (diphenhydramine) as needed for breakthrough symptoms       If symptoms return, then step up dosage

## 2018-09-01 LAB — H. PYLORI BREATH TEST: H pylori Breath Test: NEGATIVE

## 2018-09-07 LAB — ALPHA-GAL PANEL
Alpha Gal IgE*: <0.1 kU/L (ref <0.10)
Beef (Bos spp) IgE: <0.1 kU/L (ref <0.35)
Class Interpretation: 0
Class Interpretation: 0
Class Interpretation: 0
Lamb/Mutton (Ovis spp) IgE: <0.1 kU/L (ref <0.35)
Pork (Sus spp) IgE: <0.1 kU/L (ref <0.35)

## 2018-09-07 LAB — CHRONIC URTICARIA: cu index: 3.2 (ref <10)

## 2018-09-07 LAB — ALLERGEN PROFILE, FOOD-FISH
Allergen Mackerel IgE: <0.1 kU/L
Allergen Salmon IgE: <0.1 kU/L
Allergen Trout IgE: <0.1 kU/L
Allergen Walley Pike IgE: <0.1 kU/L
Codfish IgE: <0.1 kU/L
Halibut IgE: <0.1 kU/L
Tuna: <0.1 kU/L

## 2018-09-07 LAB — ANA: Anti Nuclear Antibody(ANA): NEGATIVE

## 2018-09-07 LAB — THYROID ANTIBODIES
Thyroglobulin Antibody: <1 IU/mL (ref 0.0–0.9)
Thyroperoxidase Ab SerPl-aCnc: 9 IU/mL (ref 0–34)

## 2018-09-07 LAB — TRYPTASE: Tryptase: 6.4 ug/L (ref 2.2–13.2)

## 2018-09-07 LAB — SEDIMENTATION RATE: Sed Rate: 6 mm/hr (ref 0–32)

## 2018-11-01 ENCOUNTER — Encounter: Payer: Self-pay | Admitting: Allergy and Immunology

## 2018-11-01 ENCOUNTER — Other Ambulatory Visit: Payer: Self-pay

## 2018-11-01 ENCOUNTER — Telehealth: Payer: Self-pay | Admitting: *Deleted

## 2018-11-01 ENCOUNTER — Ambulatory Visit (INDEPENDENT_AMBULATORY_CARE_PROVIDER_SITE_OTHER): Admitting: Allergy and Immunology

## 2018-11-01 DIAGNOSIS — L501 Idiopathic urticaria: Secondary | ICD-10-CM | POA: Diagnosis not present

## 2018-11-01 DIAGNOSIS — T7800XD Anaphylactic reaction due to unspecified food, subsequent encounter: Secondary | ICD-10-CM

## 2018-11-01 DIAGNOSIS — J4520 Mild intermittent asthma, uncomplicated: Secondary | ICD-10-CM

## 2018-11-01 MED ORDER — OMALIZUMAB 150 MG ~~LOC~~ SOLR
300.0000 mg | SUBCUTANEOUS | Status: DC
  Administered 2018-11-01 – 2018-12-27 (×3): 300 mg via SUBCUTANEOUS

## 2018-11-01 NOTE — Telephone Encounter (Signed)
Got it will reach out to patient to discuss

## 2018-11-01 NOTE — Progress Notes (Signed)
Immunotherapy   Patient Details  Name: Marie Jimenez MRN: [id number] Date of Birth: 03-21-1976  11/01/2018  Gladstone Lighter started injections for  [name] Frequency: Every 4 Weeks  Epi-Pen: Yes Consent signed and patient instructions given.  Patient received a 300mg  sample of Xolair today in office. Patient waited 30 minutes and did not experience any issues.    Ashleigh Fernandez-Vernon 11/01/2018, 12:16 PM

## 2018-11-01 NOTE — Assessment & Plan Note (Signed)
   I have discussed omalizumab (Xolair) with the patient and she is motivated to start this therapy.  A sample will be provided today for her to come in and start therapy.  We will submit paperwork for omalizumab (Xolair) 300 mg every 4 weeks.  Continue H1/H2 receptor blockade, titrating to the lowest effective dose necessary to suppress urticaria.  For now, continue montelukast 10 mg daily.  Additional prednisone will be provided in case of breakthrough.  I have discussed the importance of tapering to the lowest effective dose of prednisone and Marie Jimenez has verbalized understanding and agreement.

## 2018-11-01 NOTE — Telephone Encounter (Signed)
Pt received 300mg  sample of Xolair today every 4 weeks for Chronic Idiopathic Urticaria per Dr. Nunzio Cobbs. An appointment has been made for 11/29/2018 for her next injection.

## 2018-11-01 NOTE — Patient Instructions (Signed)
Chronic idiopathic urticaria  I have discussed omalizumab (Xolair) with the patient and she is motivated to start this therapy.  A sample will be provided today for her to come in and start therapy.  We will submit paperwork for omalizumab (Xolair) 300 mg every 4 weeks.  Continue H1/H2 receptor blockade, titrating to the lowest effective dose necessary to suppress urticaria.  For now, continue montelukast 10 mg daily.  Additional prednisone will be provided in case of breakthrough.  I have discussed the importance of tapering to the lowest effective dose of prednisone and Marie Jimenez has verbalized understanding and agreement.  Mild intermittent asthma  Continue albuterol HFA, 1 to 2 inhalations every 4-6 hours as needed and 15 minutes prior to exercise.  Food allergy  Continue careful avoidance of seafood and have access to epinephrine autoinjector 2 pack in case of accidental ingestion.  Food allergy action plan is in place.   Return in about 3 months (around 01/31/2019), or if symptoms worsen or fail to improve.

## 2018-11-01 NOTE — Assessment & Plan Note (Signed)
   Continue albuterol HFA, 1 to 2 inhalations every 4-6 hours as needed and 15 minutes prior to exercise. 

## 2018-11-01 NOTE — Progress Notes (Signed)
Follow-up Telemedicine Note  RE: Marie Jimenez Kronberg MRN: [id number] DOB: 1976/04/30 Date of Telemedicine Visit: 11/01/2018  Primary care provider: Macy MisBriscoe, Kim K, MD Referring provider: Macy MisBriscoe, Kim K, MD  Telemedicine Follow Up Visit via Telephone: I connected with Marie Jimenez Zaffino for a follow up on 11/01/18 by telephone and verified that I am speaking with the correct person using two identifiers.   The limitations, risks, security and privacy concerns of performing an evaluation and management service by telemedicine, the availability of in person appointments, and that there may be a patient responsible charge related to this service were discussed. The patient expressed understanding and agreed to proceed.  Patient is at home.  Provider is at the office.  Visit start time: 8:43 am Visit end time: 9:10 am Insurance consent/check in by: Victorino DikeJennifer Medical consent and medical assistant/nurse: Ladona Ridgelaylor  History of present illness: Marie Jimenez Ebrahimi is a 43 y.o. female with chronic idiopathic urticaria, food allergy, and intermittent asthma presenting today via telephone for follow-up.  She is previously seen in this clinic for her initial evaluation on August 02, 2018.  Her skin test and lab work were unrevealing of an etiology for the recurrent urticaria.  She reports that from mid January to mid February she had required levocetirizine twice daily, famotidine twice daily, montelukast daily, as well as prednisone to suppress the urticaria.  Then she went off the prednisone for for approximately 10 days prior to having her labs drawn.  At that point, the urticaria was under good control with H1/H2 blockade and montelukast.  However, on April 1 the hives returned "everywhere".  Since that time, she has required prednisone daily along with the antihistamine regimen and montelukast to suppress the urticaria.  She typically requires 20 mg of prednisone 3 times a day. She reports that her asthma has  been well controlled.  She has only required albuterol rescue on one occasion in the interval since her previous visit.  On that occasion she had been strenuously exerting herself doing housework.  She does not experience limitations in normal daily activities or nocturnal awakenings due to lower respiratory symptoms. Verlene MayerLaketha avoids seafood and has access to epinephrine autoinjectors in case of accidental ingestion followed by systemic symptoms.  Assessment and plan: Chronic idiopathic urticaria  I have discussed omalizumab (Xolair) with the patient and she is motivated to start this therapy.  A sample will be provided today for her to come in and start therapy.  We will submit paperwork for omalizumab (Xolair) 300 mg every 4 weeks.  Continue H1/H2 receptor blockade, titrating to the lowest effective dose necessary to suppress urticaria.  For now, continue montelukast 10 mg daily.  Additional prednisone will be provided in case of breakthrough.  I have discussed the importance of tapering to the lowest effective dose of prednisone and Verlene MayerLaketha has verbalized understanding and agreement.  Mild intermittent asthma  Continue albuterol HFA, 1 to 2 inhalations every 4-6 hours as needed and 15 minutes prior to exercise.  Food allergy  Continue careful avoidance of seafood and have access to epinephrine autoinjector 2 pack in case of accidental ingestion.  Food allergy action plan is in place.   Meds ordered this encounter  Medications  . omalizumab [name](XOLAIR) injection 300 mg    Diagnostics: None.  Physical examination: Physical Exam Not obtained as encounter was done via telephone.   The following portions of the patient's history were reviewed and updated as appropriate: allergies, current medications, past family history, past medical history, past  social history, past surgical history and problem list.  Allergies as of 11/01/2018      Reactions   Amoxicillin Swelling   Shellfish  Allergy Swelling      Medication List       Accurate as of November 01, 2018 12:36 PM. Always use your most recent med list.        albuterol 108 (90 Base) MCG/ACT inhaler Commonly known as:  PROVENTIL HFA;VENTOLIN HFA Inhale into the lungs.   EPINEPHrine 0.3 mg/0.3 mL Soaj injection Commonly known as:  EpiPen 2-Pak Inject 0.3 mLs (0.3 mg total) into the muscle once as needed for up to 1 dose.   famotidine 20 MG tablet Commonly known as:  Pepcid Take 1 tablet (20 mg total) by mouth 2 (two) times daily.   hydrOXYzine 25 MG tablet Commonly known as:  ATARAX/VISTARIL   levocetirizine 5 MG tablet Commonly known as:  XYZAL   levothyroxine 112 MCG tablet Commonly known as:  SYNTHROID, LEVOTHROID Take 112 mcg by mouth daily.   montelukast 10 MG tablet Commonly known as:  SINGULAIR Take 1 tablet (10 mg total) by mouth at bedtime.   phentermine 37.5 MG tablet Commonly known as:  ADIPEX-P Take by mouth.   predniSONE 10 MG (21) Tbpk tablet Commonly known as:  STERAPRED UNI-PAK 21 TAB FPD   triamcinolone cream 0.5 % Commonly known as:  KENALOG APP EXT AA BID PRF RASH       Allergies  Allergen Reactions  . Amoxicillin Swelling  . Shellfish Allergy Swelling   Review of systems: Review of systems negative except as noted in HPI / PMHx or noted below: Constitutional: Negative.  HENT: Negative.   Eyes: Negative.  Respiratory: Negative.   Cardiovascular: Negative.  Gastrointestinal: Negative.  Genitourinary: Negative.  Musculoskeletal: Negative.  Neurological: Negative.  Endo/Heme/Allergies: Negative.  Cutaneous: Negative.  Past Medical History:  Diagnosis Date  . Asthma   . Hypothyroid     Family History  Problem Relation Age of Onset  . Allergic rhinitis Neg Hx   . Asthma Neg Hx     Social History   Socioeconomic History  . Marital status: Married    Spouse name: Not on file  . Number of children: Not on file  . Years of education: Not on file  .  Highest education level: Not on file  Occupational History  . Not on file  Social Needs  . Financial resource strain: Not on file  . Food insecurity:    Worry: Not on file    Inability: Not on file  . Transportation needs:    Medical: Not on file    Non-medical: Not on file  Tobacco Use  . Smoking status: Never Smoker  . Smokeless tobacco: Never Used  Substance and Sexual Activity  . Alcohol use: Yes  . Drug use: No  . Sexual activity: Not on file  Lifestyle  . Physical activity:    Days per week: Not on file    Minutes per session: Not on file  . Stress: Not on file  Relationships  . Social connections:    Talks on phone: Not on file    Gets together: Not on file    Attends religious service: Not on file    Active member of club or organization: Not on file    Attends meetings of clubs or organizations: Not on file    Relationship status: Not on file  . Intimate partner violence:    Fear of current or  ex partner: Not on file    Emotionally abused: Not on file    Physically abused: Not on file    Forced sexual activity: Not on file  Other Topics Concern  . Not on file  Social History Narrative  . Not on file    Previous notes and tests were reviewed.  I discussed the assessment and treatment plan with the patient. The patient was provided an opportunity to ask questions and all were answered. The patient agreed with the plan and demonstrated an understanding of the instructions.   The patient was advised to call back or seek an in-person evaluation if the symptoms worsen or if the condition fails to improve as anticipated.  I provided 27 minutes of non-face-to-face time during this encounter.  I appreciate the opportunity to take part in [facility] care. Please do not hesitate to contact me with questions.  Sincerely,   R. Jorene Guest, MD

## 2018-11-01 NOTE — Assessment & Plan Note (Signed)
   Continue careful avoidance of seafood and have access to epinephrine autoinjector 2 pack in case of accidental ingestion.  Food allergy action plan is in place.

## 2018-11-03 ENCOUNTER — Other Ambulatory Visit: Payer: Self-pay | Admitting: *Deleted

## 2018-11-03 MED ORDER — OMALIZUMAB 150 MG ~~LOC~~ SOLR: 300.0000 mg | SUBCUTANEOUS | 3 refills | Status: DC

## 2018-11-03 NOTE — Telephone Encounter (Signed)
erx sent to [facility] patient started sample will reach out to patient and advise to bring same to office with next injections

## 2018-11-29 ENCOUNTER — Other Ambulatory Visit: Payer: Self-pay

## 2018-11-29 ENCOUNTER — Ambulatory Visit (INDEPENDENT_AMBULATORY_CARE_PROVIDER_SITE_OTHER)

## 2018-11-29 DIAGNOSIS — L501 Idiopathic urticaria: Secondary | ICD-10-CM | POA: Diagnosis not present

## 2018-12-27 ENCOUNTER — Other Ambulatory Visit: Payer: Self-pay

## 2018-12-27 ENCOUNTER — Ambulatory Visit (INDEPENDENT_AMBULATORY_CARE_PROVIDER_SITE_OTHER): Admitting: *Deleted

## 2018-12-27 DIAGNOSIS — L501 Idiopathic urticaria: Secondary | ICD-10-CM

## 2019-01-24 ENCOUNTER — Other Ambulatory Visit: Payer: Self-pay

## 2019-01-24 ENCOUNTER — Ambulatory Visit (INDEPENDENT_AMBULATORY_CARE_PROVIDER_SITE_OTHER): Admitting: *Deleted

## 2019-01-24 DIAGNOSIS — L501 Idiopathic urticaria: Secondary | ICD-10-CM

## 2019-01-24 MED ORDER — OMALIZUMAB 150 MG ~~LOC~~ SOLR
300.0000 mg | SUBCUTANEOUS | Status: DC
  Administered 2019-01-24 – 2019-12-14 (×15): 300 mg via SUBCUTANEOUS

## 2019-01-30 ENCOUNTER — Ambulatory Visit: Admitting: Allergy and Immunology

## 2019-02-21 ENCOUNTER — Ambulatory Visit: Payer: Self-pay

## 2019-02-22 ENCOUNTER — Other Ambulatory Visit: Payer: Self-pay

## 2019-02-22 ENCOUNTER — Ambulatory Visit (INDEPENDENT_AMBULATORY_CARE_PROVIDER_SITE_OTHER): Admitting: *Deleted

## 2019-02-22 DIAGNOSIS — L501 Idiopathic urticaria: Secondary | ICD-10-CM

## 2019-03-22 ENCOUNTER — Ambulatory Visit (INDEPENDENT_AMBULATORY_CARE_PROVIDER_SITE_OTHER): Admitting: *Deleted

## 2019-03-22 ENCOUNTER — Other Ambulatory Visit: Payer: Self-pay

## 2019-03-22 ENCOUNTER — Telehealth: Payer: Self-pay | Admitting: *Deleted

## 2019-03-22 DIAGNOSIS — L501 Idiopathic urticaria: Secondary | ICD-10-CM

## 2019-03-22 NOTE — Telephone Encounter (Signed)
Patient states that she is starting to have hives 1 week before her Xolair injection. Patient was wondering can she be increased on her Xolair dosage. Patiently currently receives 300 every 4 weeks. Please advise.

## 2019-03-22 NOTE — Telephone Encounter (Signed)
Patient states that she is starting to have hives 1 week before her Xolair shot. Patient was wondering can she be increased on her Xolair dosage. Patient currently receives 300 every 4 weeks. Please advise.

## 2019-03-22 NOTE — Telephone Encounter (Signed)
Please check with Tammy to see if pt's insurance will allow for her to receive Xolair injections every 3 weeks. Thanks

## 2019-03-22 NOTE — Telephone Encounter (Signed)
Please look into this

## 2019-03-24 MED ORDER — XOLAIR 150 MG ~~LOC~~ SOLR: 300.0000 mg | SUBCUTANEOUS | 3 refills | Status: DC

## 2019-03-24 NOTE — Telephone Encounter (Signed)
Since there is not approval process for [employer] due to it is covered benefit through the medical coverage I will send new ERx with the dose increase to see if they will pay for same. Once I see that insurance will let the new dose be dispensed I will advise patient to schedule to every 3 weeks. I will also advise patient that we are working on same.

## 2019-03-24 NOTE — Addendum Note (Signed)
Addended by: Carin Hock on: 03/24/2019 09:01 AM   Modules accepted: Orders

## 2019-03-24 NOTE — Telephone Encounter (Signed)
Advised patient R/s appt to 3 weeks and updated Rx to [facility] sent

## 2019-04-12 ENCOUNTER — Ambulatory Visit (INDEPENDENT_AMBULATORY_CARE_PROVIDER_SITE_OTHER): Admitting: *Deleted

## 2019-04-12 ENCOUNTER — Ambulatory Visit: Payer: Self-pay

## 2019-04-12 ENCOUNTER — Other Ambulatory Visit: Payer: Self-pay

## 2019-04-12 DIAGNOSIS — L501 Idiopathic urticaria: Secondary | ICD-10-CM

## 2019-04-19 ENCOUNTER — Ambulatory Visit: Payer: Self-pay

## 2019-05-03 ENCOUNTER — Ambulatory Visit (INDEPENDENT_AMBULATORY_CARE_PROVIDER_SITE_OTHER)

## 2019-05-03 ENCOUNTER — Other Ambulatory Visit: Payer: Self-pay

## 2019-05-03 DIAGNOSIS — L501 Idiopathic urticaria: Secondary | ICD-10-CM | POA: Diagnosis not present

## 2019-05-24 ENCOUNTER — Ambulatory Visit (INDEPENDENT_AMBULATORY_CARE_PROVIDER_SITE_OTHER): Admitting: *Deleted

## 2019-05-24 ENCOUNTER — Other Ambulatory Visit: Payer: Self-pay

## 2019-05-24 DIAGNOSIS — L501 Idiopathic urticaria: Secondary | ICD-10-CM

## 2019-06-14 ENCOUNTER — Ambulatory Visit: Payer: Self-pay

## 2019-06-22 ENCOUNTER — Ambulatory Visit (INDEPENDENT_AMBULATORY_CARE_PROVIDER_SITE_OTHER): Admitting: *Deleted

## 2019-06-22 ENCOUNTER — Other Ambulatory Visit: Payer: Self-pay

## 2019-06-22 DIAGNOSIS — L501 Idiopathic urticaria: Secondary | ICD-10-CM | POA: Diagnosis not present

## 2019-07-12 ENCOUNTER — Telehealth: Payer: Self-pay | Admitting: *Deleted

## 2019-07-12 ENCOUNTER — Encounter (HOSPITAL_COMMUNITY): Payer: Self-pay | Admitting: Emergency Medicine

## 2019-07-12 ENCOUNTER — Other Ambulatory Visit: Payer: Self-pay

## 2019-07-12 ENCOUNTER — Emergency Department (HOSPITAL_COMMUNITY)
Admission: EM | Admit: 2019-07-12 | Discharge: 2019-07-12 | Disposition: A | Discharge status: Home / Self Care | Attending: Emergency Medicine | Admitting: Emergency Medicine

## 2019-07-12 ENCOUNTER — Ambulatory Visit (INDEPENDENT_AMBULATORY_CARE_PROVIDER_SITE_OTHER): Admitting: *Deleted

## 2019-07-12 DIAGNOSIS — T7840XA Allergy, unspecified, initial encounter: Secondary | ICD-10-CM | POA: Diagnosis not present

## 2019-07-12 DIAGNOSIS — E669 Obesity, unspecified: Secondary | ICD-10-CM | POA: Insufficient documentation

## 2019-07-12 DIAGNOSIS — Z6833 Body mass index (BMI) 33.0-33.9, adult: Secondary | ICD-10-CM | POA: Diagnosis not present

## 2019-07-12 DIAGNOSIS — Z8709 Personal history of other diseases of the respiratory system: Secondary | ICD-10-CM | POA: Diagnosis not present

## 2019-07-12 DIAGNOSIS — R0602 Shortness of breath: Secondary | ICD-10-CM | POA: Diagnosis present

## 2019-07-12 DIAGNOSIS — Z20828 Contact with and (suspected) exposure to other viral communicable diseases: Secondary | ICD-10-CM | POA: Diagnosis not present

## 2019-07-12 DIAGNOSIS — L501 Idiopathic urticaria: Secondary | ICD-10-CM

## 2019-07-12 DIAGNOSIS — Z79899 Other long term (current) drug therapy: Secondary | ICD-10-CM | POA: Insufficient documentation

## 2019-07-12 DIAGNOSIS — E039 Hypothyroidism, unspecified: Secondary | ICD-10-CM | POA: Diagnosis not present

## 2019-07-12 LAB — SARS CORONAVIRUS 2 (TAT 6-24 HRS): SARS Coronavirus 2: NEGATIVE

## 2019-07-12 MED ORDER — DIPHENHYDRAMINE HCL 25 MG PO TABS: 25.0000 mg | ORAL_TABLET | Freq: Four times a day (QID) | ORAL | 0 refills | Status: DC

## 2019-07-12 MED ORDER — METHYLPREDNISOLONE SODIUM SUCC 125 MG IJ SOLR
125.0000 mg | Freq: Once | INTRAMUSCULAR | Status: AC
  Administered 2019-07-12: 125 mg via INTRAVENOUS
  Filled 2019-07-12: qty 2

## 2019-07-12 MED ORDER — LORAZEPAM 2 MG/ML IJ SOLN
0.5000 mg | Freq: Once | INTRAMUSCULAR | Status: AC
  Administered 2019-07-12: 0.5 mg via INTRAVENOUS
  Filled 2019-07-12: qty 1

## 2019-07-12 MED ORDER — FAMOTIDINE 20 MG PO TABS: 20.0000 mg | ORAL_TABLET | Freq: Two times a day (BID) | ORAL | 0 refills | Status: DC

## 2019-07-12 MED ORDER — FAMOTIDINE IN NACL 20-0.9 MG/50ML-% IV SOLN
20.0000 mg | Freq: Once | INTRAVENOUS | Status: AC
  Administered 2019-07-12: 20 mg via INTRAVENOUS
  Filled 2019-07-12: qty 50

## 2019-07-12 MED ORDER — DIPHENHYDRAMINE HCL 50 MG/ML IJ SOLN
50.0000 mg | Freq: Once | INTRAMUSCULAR | Status: AC
  Administered 2019-07-12: 50 mg via INTRAVENOUS
  Filled 2019-07-12: qty 1

## 2019-07-12 NOTE — ED Notes (Signed)
Pt ambulatory to restroom with steady gait. Pt returned and placed back on monitoring devices.

## 2019-07-12 NOTE — Telephone Encounter (Signed)
Noted. Thank you. Merry Christmas!

## 2019-07-12 NOTE — Discharge Instructions (Addendum)
You have been diagnosed today with shortness of breath, possible allergic reaction.  At this time there does not appear to be the presence of an emergent medical condition, however there is always the potential for conditions to change. Please read and follow the below instructions.  Please return to the Emergency Department immediately for any new or worsening symptoms or if your symptoms return. Please be sure to follow up with your Primary Care Provider within one week regarding your visit today; please call their office to schedule an appointment even if you are feeling better for a follow-up visit. Please continue taking the medications Benadryl and Pepcid as prescribed to help with possible allergic reaction.  Please continue to carry your EpiPen as instructed by your allergist.  If you ever use your EpiPen you must call 911 and be evaluated at the emergency department to ensure no recurrence of reaction.  The medications Benadryl and Pepcid may make you drowsy so do not drive/perform dangerous activities, drink alcohol or take other sedating medications while taking Benadryl and Pepcid. Call your allergist office today to schedule a follow-up appointment for reevaluation. Your Covid test is pending and will result in 1-2 days.  Please check your MyChart account for the results.  Please continue to wear a mask while in public.  Please continue to isolate to avoid potential spread of any virus.  Discuss your Covid results with your primary care provider at your next visit.  Get help right away if: You have signs of an allergic reaction. You may notice them soon after being exposed to things that give you an allergic reaction. Signs may include: Warmth in your face. Your face may turn red. Itchy, red, swollen areas of skin. Swelling of your: Eyes. Lips. Face. Mouth. Tongue. Throat. Trouble with any of these: Breathing. Talking. Swallowing. Loud breathing (wheezing). Feeling dizzy or  light-headed. Passing out. Pain or cramps in your belly. Throwing up. Watery poop. You had to use your auto-injector pen. You must go to the emergency room even if the medicine seems to be working. This is because another allergic reaction may happen within 3 days (rebound anaphylaxis). Your shortness of breath returns You have trouble breathing when you are resting. You feel light-headed or you pass out (faint). You have a cough that is not helped by medicines. You cough up blood. You have pain with breathing. You have pain in your chest, arms, shoulders, or belly (abdomen). You have a fever. You have any new/concerning or worsening of symptoms  Please read the additional information packets attached to your discharge summary.  Do not take your medicine if  develop an itchy rash, swelling in your mouth or lips, or difficulty breathing; call 911 and seek immediate emergency medical attention if this occurs.  Note: Portions of this text may have been transcribed using voice recognition software. Every effort was made to ensure accuracy; however, inadvertent computerized transcription errors may still be present.

## 2019-07-12 NOTE — Telephone Encounter (Signed)
Patient called stating 1 hour after her injection that she was having issues breathing, having chest pain, and felt light headed and felt like she was going to pass out. Patient received her normal 300mg  dose of Xolair. Patient is currently being evaluated at the Emergency Room. Advised to patient that will call back to follow up with her this evening to see how she is doing.

## 2019-07-12 NOTE — Telephone Encounter (Signed)
Dr. Ernst Bowler noting as K. Ac Colan, CMA-Left voicemail for patient with google voice number so that she can call back once out of ED with an update.

## 2019-07-12 NOTE — Telephone Encounter (Signed)
Noted. Please call her back this afternoon to check in on her. No more Xolair injections for now. She will need to follow up here in the office to discuss. Thanks.

## 2019-07-12 NOTE — ED Provider Notes (Signed)
[facility] EMERGENCY DEPARTMENT Provider Note   CSN: [id number] Arrival date & time: 07/12/19  1158     History Chief Complaint  Patient presents with   Shortness of Breath   possible allergic reaction    Marie Jimenez is a 43 y.o. female history of asthma, hypothyroidism.  Patient presents today shortly after leaving her allergist's office after receiving a Xolair injection.  She reports that she has been receiving these injections every 3 weeks for the past 9 months and has never had a problem before.  She reports that approximately 20 minutes after receiving the Xolair injection in her deltoid she developed shortness of breath.  Patient reports symptoms have been worsening since onset she describes difficulty taking a full breath in and feeling lightheaded.  She had left the allergist office prior to onset of symptoms and drove herself across the street to this ED for evaluation.  Denies recent illness, fever/chills, headache, neck pain, chest pain, cough/hemoptysis, abdominal pain, nausea/vomiting, diarrhea, rash, pruritus, swelling in the face/throat/neck, difficulty swallowing, extremity swelling/color change or any additional concerns.   HPI     Past Medical History:  Diagnosis Date   Asthma    Hypothyroid     Patient Active Problem List   Diagnosis Date Noted   Chronic idiopathic urticaria 08/02/2018   Food allergy 08/02/2018   Mild intermittent asthma 08/02/2018    Past Surgical History:  Procedure Laterality Date   APPENDECTOMY     THYROIDECTOMY     TUBAL LIGATION       OB History   No obstetric history on file.     Family History  Problem Relation Age of Onset   Allergic rhinitis Neg Hx    Asthma Neg Hx     Social History   Tobacco Use   Smoking status: Never Smoker   Smokeless tobacco: Never Used  Substance Use Topics   Alcohol use: Yes   Drug use: No    Home Medications Prior to Admission medications     Medication Sig Start Date End Date Taking? Authorizing Provider  albuterol (PROVENTIL HFA;VENTOLIN HFA) 108 (90 Base) MCG/ACT inhaler Inhale into the lungs. 10/17/18   [provider]  diphenhydrAMINE (BENADRYL) 25 MG tablet Take 1 tablet (25 mg total) by mouth every 6 (six) hours. 07/12/19   Harlene Salts A, PA-C  EPINEPHrine (EPIPEN 2-PAK) 0.3 mg/0.3 mL IJ SOAJ injection Inject 0.3 mLs (0.3 mg total) into the muscle once as needed for up to 1 dose. 08/02/18   Bobbitt, Heywood Iles, MD  famotidine (PEPCID) 20 MG tablet Take 1 tablet (20 mg total) by mouth 2 (two) times daily. 07/12/19   Harlene Salts A, PA-C  hydrOXYzine (ATARAX/VISTARIL) 25 MG tablet  10/20/18   [provider]  levocetirizine (XYZAL) 5 MG tablet  10/21/18   [provider]  levothyroxine (SYNTHROID, LEVOTHROID) 112 MCG tablet Take 112 mcg by mouth daily. 04/19/18   [provider]  montelukast (SINGULAIR) 10 MG tablet Take 1 tablet (10 mg total) by mouth at bedtime. 08/02/18   Bobbitt, Heywood Iles, MD  omalizumab [name]) 150 MG injection Inject 300 mg into the skin every 14 (fourteen) days. 03/24/19   Bobbitt, Heywood Iles, MD  predniSONE (STERAPRED UNI-PAK 21 TAB) 10 MG (21) TBPK tablet FPD 07/23/18   [provider]  triamcinolone cream (KENALOG) 0.5 % APP EXT AA BID PRF RASH 10/22/18   [provider]    Allergies    Amoxicillin and Shellfish allergy  Review of Systems   Review of Systems Ten systems are reviewed and are negative for acute change except as noted in the HPI  Physical Exam Updated Vital Signs BP 119/72 (BP Location: Left Arm)    Pulse 79    Temp 98.8 F (37.1 C) (Oral)    Resp 18    Ht 5\' 10"  (1.778 m)    Wt 106.6 kg    SpO2 98%    BMI 33.72 kg/m   Physical Exam Constitutional:      General: She is not in acute distress.    Appearance: Normal appearance. She is well-developed. She is obese. She is not ill-appearing or diaphoretic.  HENT:     Head:  Normocephalic and atraumatic.     Right Ear: External ear normal.     Left Ear: External ear normal.     Nose: Nose normal.  Eyes:     General: Vision grossly intact. Gaze aligned appropriately.     Pupils: Pupils are equal, round, and reactive to light.  Neck:     Trachea: Trachea and phonation normal. No tracheal deviation.  Cardiovascular:     Rate and Rhythm: Normal rate and regular rhythm.  Pulmonary:     Effort: Pulmonary effort is normal. No accessory muscle usage or respiratory distress.     Breath sounds: Normal breath sounds.  Chest:     Chest wall: No deformity, tenderness or crepitus.  Abdominal:     General: There is no distension.     Palpations: Abdomen is soft.     Tenderness: There is no abdominal tenderness. There is no guarding or rebound.  Musculoskeletal:        General: Normal range of motion.     Cervical back: Normal range of motion.     Right lower leg: No tenderness. No edema.     Left lower leg: No tenderness. No edema.  Skin:    General: Skin is warm and dry.     Findings: No rash.  Neurological:     Mental Status: She is alert.     GCS: GCS eye subscore is 4. GCS verbal subscore is 5. GCS motor subscore is 6.     Comments: Speech is clear and goal oriented, follows commands Major Cranial nerves without deficit, no facial droop Moves extremities without ataxia, coordination intact  Psychiatric:        Behavior: Behavior normal.     ED Results / Procedures / Treatments   Labs (all labs ordered are listed, but only abnormal results are displayed) Labs Reviewed  SARS CORONAVIRUS 2 (TAT 6-24 HRS)  TRYPTASE    EKG EKG Interpretation  Date/Time:  Wednesday July 12 2019 12:02:11 EST Ventricular Rate:  82 PR Interval:  158 QRS Duration: 82 QT Interval:  384 QTC Calculation: 448 R Axis:   68 Text Interpretation: Normal sinus rhythm Right atrial enlargement Borderline ECG No significant change was found Confirmed by Gerlene Fee [phone])  on 07/12/2019 4:26:03 PM   Radiology No results found.  Procedures Procedures (including critical care time)  Medications Ordered in ED Medications  diphenhydrAMINE (BENADRYL) injection 50 mg (50 mg Intravenous Given 07/12/19 1328)  famotidine (PEPCID) IVPB 20 mg premix (0 mg Intravenous Stopped 07/12/19 1448)  methylPREDNISolone sodium succinate (SOLU-MEDROL) 125 mg/2 mL injection 125 mg (125 mg Intravenous Given 07/12/19 1328)  LORazepam (ATIVAN) injection 0.5 mg (0.5 mg Intravenous Given 07/12/19 1328)    ED Course  I have reviewed the triage vital signs and the  nursing notes.  Pertinent labs & imaging results that were available during my care of the patient were reviewed by me and considered in my medical decision making (see chart for details).  Clinical Course as of Jul 11 1714  Wed Jul 12, 2019  1351 Dr. Selena BattenKim; tryptase.   [BM]    Clinical Course User Index [BM] Elizabeth PalauMorelli, Kori Colin A, PA-C   MDM Rules/Calculators/A&P                     Patient arrives shortly after receiving Xolair injection at her allergist, approximately 20-25 minutes after injection developed shortness of breath and lightheadedness.  On initial evaluation patient is anxious appearing, nontoxic and in no acute distress.  No tachycardia or hypoxia on room air.  Lungs clear to auscultation bilaterally.  Neurovascular intact in all 4 extremities without evidence of DVT. There is no rash or sign of oral swelling, she has no GI symptoms, does not need criteria for anaphylaxis at this time.  Discussed case with Dr. Adela LankFloyd, will give Solu-Medrol, Benadryl, Pepcid and Ativan and observe patient. - 1:43 PM: Patient reevaluated resting comfortably no acute distress using her phone.  Endorses she is feeling somewhat improved since receiving medications approximately 15 minutes ago. - 1:51 PM: Discussed case with allergist Dr. Selena BattenKim from patient's clinic, she has asked that a tryptase been ordered that their office will  follow up on during the next visit, no additional orders or recommendations. - 2:23 PM: Patient reassessed, reports she is feeling "fine", reports improvement of her symptoms, is agreeable to continued observation. - 4 PM: Patient reassessed resting comfortably no acute distress.  Reports that she is feeling well and has no complaints at this time. - EKG: Normal sinus rhythm Right atrial enlargement Borderline ECG No significant change was found Confirmed by Kennis CarinaBero, Michael [phone] on 07/12/2019 4:26:03 PM  4:55 PM: Patient reevaluated resting comfortably no acute distress is requesting to be discharged.  She has been in this ER for over 5 hours at this point.  Her symptoms have resolved, she did not have a second system involved and did not meet criteria for anaphylaxis.  Additionally her EKG is reassuring today, she has no chest pain, pleurisy, cough/hemoptysis or evidence of DVT.  She has no tachycardia or hypoxia on room air.  Doubt ACS, PE, dissection, infection or other acute cardiopulmonary etiologies of patient's symptoms today.  Plan to discharge with Benadryl and Pepcid and encourage PCP and allergist follow-up.  Offered patient EpiPen at discharge she reports that she has 2 already with her and does not need refill.  On reevaluation no evidence of oral swelling or airway compromise, evaluation does not show pathology that would require ongoing emergent intervention or inpatient treatment; patient is hemodynamically stable, NAD and denies feeling of throat closing.  At discharge patient requesting outpatient Covid test, she is asymptomatic, will send the outpatient 1-2-day Covid test and patient will follow up her results on MyChart account and discussed them with her primary care provider.  No indication for further work-up or admission at this time.  At this time there does not appear to be any evidence of an acute emergency medical condition and the patient appears stable for discharge with  appropriate outpatient follow up. Diagnosis was discussed with patient who verbalizes understanding of care plan and is agreeable to discharge. I have discussed return precautions with patient who verbalizes understanding of return precautions. Patient encouraged to follow-up with their PCP and allergist. All questions  answered.  Patient's case discussed with Dr. Pilar Plate who agrees with plan to discharge with follow-up.   Marie Jimenez was evaluated in Emergency Department on 07/12/2019 for the symptoms described in the history of present illness. She was evaluated in the context of the global COVID-19 pandemic, which necessitated consideration that the patient might be at risk for infection with the SARS-CoV-2 virus that causes COVID-19. Institutional protocols and algorithms that pertain to the evaluation of patients at risk for COVID-19 are in a state of rapid change based on information released by regulatory bodies including the CDC and federal and state organizations. These policies and algorithms were followed during the patient's care in the ED.  Note: Portions of this report may have been transcribed using voice recognition software. Every effort was made to ensure accuracy; however, inadvertent computerized transcription errors may still be present. Final Clinical Impression(s) / ED Diagnoses Final diagnoses:  Shortness of breath  Allergic reaction, initial encounter    Rx / DC Orders ED Discharge Orders         Ordered    famotidine (PEPCID) 20 MG tablet  2 times daily     07/12/19 1701    diphenhydrAMINE (BENADRYL) 25 MG tablet  Every 6 hours     07/12/19 1701           Elizabeth Palau 07/12/19 1717    Melene Plan, DO 07/15/19 0710

## 2019-07-12 NOTE — Telephone Encounter (Signed)
Spoke with PA in the ER. Patient stable and doing better.  Following meds given: ativan, benadryl, Pepcid and solumedrol. Advised to draw tryptase level.  Stop Xolair and follow up with Dr. Verlin Fester.   Thank you.

## 2019-07-12 NOTE — Telephone Encounter (Signed)
Called patient to advise of Dr. Mariane Masters plan and to see how she is doing. Left voicemail asking for her to return call.

## 2019-07-12 NOTE — ED Triage Notes (Signed)
Pt reports she got injection of xolair this am, after leaving the office and driving home, she began to feel very sob. Pt denies any difficulty swallowing or rashes. Pt concerned she may not have gotten the correct medication because she has gotten this medication many times before with no issues. Pt tachypneic in triage.

## 2019-07-16 LAB — TRYPTASE: Tryptase: 5.1 ug/L (ref 2.2–13.2)

## 2019-07-17 NOTE — Telephone Encounter (Signed)
Patient has been explained plan for next Xolair injections. Immunotherapy flowsheet has been updated to reflect these changes for the next injection.

## 2019-07-17 NOTE — Telephone Encounter (Signed)
Given the patient's normal tryptase level in the ER and her desire to continue with Xolair injections, on her next injection visit, she will receive 1/10 dose then be put into room #1 for observation for the next 30 minutes.  If she has no untoward symptoms, she will receive the remaining 9/10 dose followed by observation for 60 minutes.  If she has no problems, she may return to Xolair injections in the typical fashion. Thank you.

## 2019-07-17 NOTE — Telephone Encounter (Signed)
Called to follow up on patient and she stated that she is feeling great and she does not believe that it was an allergic reaction to the Xolair injection since her Tryptase in the ED was normal. Patient would like to continue her normal schedule with receiving her Xolair injections. Please advise.

## 2019-08-02 ENCOUNTER — Ambulatory Visit: Payer: Self-pay

## 2019-08-10 ENCOUNTER — Other Ambulatory Visit: Payer: Self-pay

## 2019-08-10 ENCOUNTER — Ambulatory Visit (INDEPENDENT_AMBULATORY_CARE_PROVIDER_SITE_OTHER)

## 2019-08-10 DIAGNOSIS — L501 Idiopathic urticaria: Secondary | ICD-10-CM

## 2019-08-31 ENCOUNTER — Other Ambulatory Visit: Payer: Self-pay

## 2019-08-31 ENCOUNTER — Ambulatory Visit (INDEPENDENT_AMBULATORY_CARE_PROVIDER_SITE_OTHER)

## 2019-08-31 DIAGNOSIS — L501 Idiopathic urticaria: Secondary | ICD-10-CM

## 2019-09-21 ENCOUNTER — Ambulatory Visit (INDEPENDENT_AMBULATORY_CARE_PROVIDER_SITE_OTHER)

## 2019-09-21 ENCOUNTER — Other Ambulatory Visit: Payer: Self-pay

## 2019-09-21 DIAGNOSIS — L501 Idiopathic urticaria: Secondary | ICD-10-CM | POA: Diagnosis not present

## 2019-10-11 ENCOUNTER — Other Ambulatory Visit: Payer: Self-pay | Admitting: *Deleted

## 2019-10-11 MED ORDER — PREDNISONE 10 MG PO TABS: 10.0000 mg | ORAL_TABLET | Freq: Every day | ORAL | 0 refills | Status: AC

## 2019-10-12 ENCOUNTER — Other Ambulatory Visit: Payer: Self-pay

## 2019-10-12 ENCOUNTER — Ambulatory Visit (INDEPENDENT_AMBULATORY_CARE_PROVIDER_SITE_OTHER)

## 2019-10-12 DIAGNOSIS — L501 Idiopathic urticaria: Secondary | ICD-10-CM

## 2019-11-02 ENCOUNTER — Ambulatory Visit (INDEPENDENT_AMBULATORY_CARE_PROVIDER_SITE_OTHER)

## 2019-11-02 ENCOUNTER — Other Ambulatory Visit: Payer: Self-pay

## 2019-11-02 DIAGNOSIS — L501 Idiopathic urticaria: Secondary | ICD-10-CM

## 2019-11-23 ENCOUNTER — Ambulatory Visit (INDEPENDENT_AMBULATORY_CARE_PROVIDER_SITE_OTHER)

## 2019-11-23 ENCOUNTER — Other Ambulatory Visit: Payer: Self-pay

## 2019-11-23 DIAGNOSIS — L501 Idiopathic urticaria: Secondary | ICD-10-CM | POA: Diagnosis not present

## 2019-12-14 ENCOUNTER — Ambulatory Visit (INDEPENDENT_AMBULATORY_CARE_PROVIDER_SITE_OTHER)

## 2019-12-14 ENCOUNTER — Other Ambulatory Visit: Payer: Self-pay

## 2019-12-14 DIAGNOSIS — L501 Idiopathic urticaria: Secondary | ICD-10-CM | POA: Diagnosis not present

## 2020-01-02 ENCOUNTER — Other Ambulatory Visit: Payer: Self-pay

## 2020-01-02 ENCOUNTER — Ambulatory Visit (INDEPENDENT_AMBULATORY_CARE_PROVIDER_SITE_OTHER)

## 2020-01-02 DIAGNOSIS — L501 Idiopathic urticaria: Secondary | ICD-10-CM

## 2020-01-02 MED ORDER — OMALIZUMAB 150 MG ~~LOC~~ SOLR
300.0000 mg | SUBCUTANEOUS | Status: DC
  Administered 2020-01-02 – 2022-10-30 (×49): 300 mg via SUBCUTANEOUS

## 2020-01-23 ENCOUNTER — Ambulatory Visit: Payer: Self-pay

## 2020-01-24 ENCOUNTER — Other Ambulatory Visit: Payer: Self-pay

## 2020-01-24 ENCOUNTER — Ambulatory Visit (INDEPENDENT_AMBULATORY_CARE_PROVIDER_SITE_OTHER)

## 2020-01-24 DIAGNOSIS — L501 Idiopathic urticaria: Secondary | ICD-10-CM

## 2020-02-14 ENCOUNTER — Ambulatory Visit (INDEPENDENT_AMBULATORY_CARE_PROVIDER_SITE_OTHER)

## 2020-02-14 ENCOUNTER — Other Ambulatory Visit: Payer: Self-pay

## 2020-02-14 DIAGNOSIS — L501 Idiopathic urticaria: Secondary | ICD-10-CM

## 2020-03-06 ENCOUNTER — Other Ambulatory Visit: Payer: Self-pay

## 2020-03-06 ENCOUNTER — Ambulatory Visit (INDEPENDENT_AMBULATORY_CARE_PROVIDER_SITE_OTHER)

## 2020-03-06 DIAGNOSIS — L501 Idiopathic urticaria: Secondary | ICD-10-CM | POA: Diagnosis not present

## 2020-03-20 ENCOUNTER — Other Ambulatory Visit: Payer: Self-pay | Admitting: *Deleted

## 2020-03-20 MED ORDER — XOLAIR 150 MG ~~LOC~~ SOLR: 300.0000 mg | SUBCUTANEOUS | 3 refills | Status: DC

## 2020-03-27 ENCOUNTER — Ambulatory Visit (INDEPENDENT_AMBULATORY_CARE_PROVIDER_SITE_OTHER)

## 2020-03-27 ENCOUNTER — Other Ambulatory Visit: Payer: Self-pay

## 2020-03-27 DIAGNOSIS — L501 Idiopathic urticaria: Secondary | ICD-10-CM

## 2020-04-17 ENCOUNTER — Ambulatory Visit (INDEPENDENT_AMBULATORY_CARE_PROVIDER_SITE_OTHER)

## 2020-04-17 DIAGNOSIS — L501 Idiopathic urticaria: Secondary | ICD-10-CM | POA: Diagnosis not present

## 2020-04-29 ENCOUNTER — Ambulatory Visit

## 2020-05-08 ENCOUNTER — Ambulatory Visit: Payer: Self-pay

## 2020-05-13 ENCOUNTER — Other Ambulatory Visit: Payer: Self-pay

## 2020-05-13 ENCOUNTER — Ambulatory Visit (INDEPENDENT_AMBULATORY_CARE_PROVIDER_SITE_OTHER): Admitting: *Deleted

## 2020-05-13 DIAGNOSIS — L501 Idiopathic urticaria: Secondary | ICD-10-CM | POA: Diagnosis not present

## 2020-06-03 ENCOUNTER — Ambulatory Visit: Payer: Self-pay

## 2020-06-03 ENCOUNTER — Other Ambulatory Visit: Payer: Self-pay

## 2020-06-03 ENCOUNTER — Ambulatory Visit (INDEPENDENT_AMBULATORY_CARE_PROVIDER_SITE_OTHER): Admitting: *Deleted

## 2020-06-03 DIAGNOSIS — L501 Idiopathic urticaria: Secondary | ICD-10-CM | POA: Diagnosis not present

## 2020-06-24 ENCOUNTER — Ambulatory Visit (INDEPENDENT_AMBULATORY_CARE_PROVIDER_SITE_OTHER)

## 2020-06-24 ENCOUNTER — Other Ambulatory Visit: Payer: Self-pay

## 2020-06-24 DIAGNOSIS — L501 Idiopathic urticaria: Secondary | ICD-10-CM | POA: Diagnosis not present

## 2020-07-22 ENCOUNTER — Ambulatory Visit (INDEPENDENT_AMBULATORY_CARE_PROVIDER_SITE_OTHER): Admitting: *Deleted

## 2020-07-22 ENCOUNTER — Other Ambulatory Visit: Payer: Self-pay

## 2020-07-22 DIAGNOSIS — L501 Idiopathic urticaria: Secondary | ICD-10-CM

## 2020-08-12 ENCOUNTER — Other Ambulatory Visit: Payer: Self-pay

## 2020-08-12 ENCOUNTER — Ambulatory Visit (INDEPENDENT_AMBULATORY_CARE_PROVIDER_SITE_OTHER)

## 2020-08-12 DIAGNOSIS — L501 Idiopathic urticaria: Secondary | ICD-10-CM | POA: Diagnosis not present

## 2020-09-02 ENCOUNTER — Ambulatory Visit: Payer: Self-pay

## 2020-09-03 ENCOUNTER — Emergency Department (HOSPITAL_COMMUNITY)

## 2020-09-03 ENCOUNTER — Encounter (HOSPITAL_COMMUNITY): Payer: Self-pay | Admitting: Emergency Medicine

## 2020-09-03 ENCOUNTER — Emergency Department (HOSPITAL_COMMUNITY)
Admission: EM | Admit: 2020-09-03 | Discharge: 2020-09-03 | Disposition: A | Discharge status: Home / Self Care | Attending: Emergency Medicine | Admitting: Emergency Medicine

## 2020-09-03 ENCOUNTER — Other Ambulatory Visit: Payer: Self-pay

## 2020-09-03 DIAGNOSIS — R600 Localized edema: Secondary | ICD-10-CM | POA: Insufficient documentation

## 2020-09-03 DIAGNOSIS — R110 Nausea: Secondary | ICD-10-CM | POA: Diagnosis not present

## 2020-09-03 DIAGNOSIS — Z7951 Long term (current) use of inhaled steroids: Secondary | ICD-10-CM | POA: Diagnosis not present

## 2020-09-03 DIAGNOSIS — R1084 Generalized abdominal pain: Secondary | ICD-10-CM | POA: Insufficient documentation

## 2020-09-03 DIAGNOSIS — E039 Hypothyroidism, unspecified: Secondary | ICD-10-CM | POA: Diagnosis not present

## 2020-09-03 DIAGNOSIS — R0602 Shortness of breath: Secondary | ICD-10-CM | POA: Diagnosis not present

## 2020-09-03 DIAGNOSIS — R072 Precordial pain: Secondary | ICD-10-CM | POA: Insufficient documentation

## 2020-09-03 DIAGNOSIS — R079 Chest pain, unspecified: Secondary | ICD-10-CM | POA: Diagnosis present

## 2020-09-03 DIAGNOSIS — J45909 Unspecified asthma, uncomplicated: Secondary | ICD-10-CM | POA: Diagnosis not present

## 2020-09-03 DIAGNOSIS — Z79899 Other long term (current) drug therapy: Secondary | ICD-10-CM | POA: Insufficient documentation

## 2020-09-03 LAB — HEPATIC FUNCTION PANEL
ALT: 7 U/L (ref 0–44)
AST: 19 U/L (ref 15–41)
Albumin: 3.1 g/dL — ABNORMAL LOW (ref 3.5–5.0)
Alkaline Phosphatase: 70 U/L (ref 38–126)
Bilirubin, Direct: <0.1 mg/dL (ref 0.0–0.2)
Total Bilirubin: 0.5 mg/dL (ref 0.3–1.2)
Total Protein: 6.7 g/dL (ref 6.5–8.1)

## 2020-09-03 LAB — CBC
HCT: 34.4 % — ABNORMAL LOW (ref 36.0–46.0)
Hemoglobin: 11.3 g/dL — ABNORMAL LOW (ref 12.0–15.0)
MCH: 30.2 pg (ref 26.0–34.0)
MCHC: 32.8 g/dL (ref 30.0–36.0)
MCV: 92 fL (ref 80.0–100.0)
Platelets: 283 10*3/uL (ref 150–400)
RBC: 3.74 MIL/uL — ABNORMAL LOW (ref 3.87–5.11)
RDW: 12.7 % (ref 11.5–15.5)
WBC: 4.8 10*3/uL (ref 4.0–10.5)
nRBC: 0 % (ref 0.0–0.2)

## 2020-09-03 LAB — BASIC METABOLIC PANEL
Anion gap: 9 (ref 5–15)
BUN: 9 mg/dL (ref 6–20)
CO2: 23 mmol/L (ref 22–32)
Calcium: 8.5 mg/dL — ABNORMAL LOW (ref 8.9–10.3)
Chloride: 106 mmol/L (ref 98–111)
Creatinine, Ser: 0.82 mg/dL (ref 0.44–1.00)
GFR, Estimated: >60 mL/min (ref >60)
Glucose, Bld: 105 mg/dL — ABNORMAL HIGH (ref 70–99)
Potassium: 3.8 mmol/L (ref 3.5–5.1)
Sodium: 138 mmol/L (ref 135–145)

## 2020-09-03 LAB — PROTIME-INR
INR: 1 (ref 0.8–1.2)
Prothrombin Time: 12.8 seconds (ref 11.4–15.2)

## 2020-09-03 LAB — LIPASE, BLOOD: Lipase: 37 U/L (ref 11–51)

## 2020-09-03 LAB — I-STAT BETA HCG BLOOD, ED (MC, WL, AP ONLY): I-stat hCG, quantitative: <5 m[IU]/mL (ref <5)

## 2020-09-03 LAB — D-DIMER, QUANTITATIVE: D-Dimer, Quant: 0.86 ug/mL-FEU — ABNORMAL HIGH (ref 0.00–0.50)

## 2020-09-03 LAB — TROPONIN I (HIGH SENSITIVITY)
Troponin I (High Sensitivity): 3 ng/L (ref <18)
Troponin I (High Sensitivity): 3 ng/L (ref <18)

## 2020-09-03 MED ORDER — IOHEXOL 350 MG/ML SOLN
75.0000 mL | Freq: Once | INTRAVENOUS | Status: AC | PRN
  Administered 2020-09-03: 75 mL via INTRAVENOUS

## 2020-09-03 NOTE — ED Provider Notes (Signed)
[facility] EMERGENCY DEPARTMENT Provider Note   CSN: [id number] Arrival date & time: 09/03/20  9381     History Chief Complaint  Patient presents with  . Chest Pain    Marie Jimenez is a 45 y.o. female.  Patient with history of asthma, thyroidectomy, appendectomy --presents to the emergency department today for evaluation of chest pain and shortness of breath.  Patient began having symptoms yesterday.  She reports generalized pain in her abdomen that then radiated up into her chest.  This was associated with nausea and shortness of breath.  She did not have wheezing or symptoms that were typical for her asthma.  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  She reports chronic bilateral lower extremity swelling but no history of heart failure.  Pain in her chest was generalized.  It did not radiate.  No associated vomiting or diarrhea.  No diaphoresis.  Patient feels short of breath at rest and had difficulty sleeping last night.  Activity does make the shortness of breath worse.  She has tried her inhalers without much improvement.        Past Medical History:  Diagnosis Date  . Asthma   . Hypothyroid     Patient Active Problem List   Diagnosis Date Noted  . Chronic idiopathic urticaria 08/02/2018  . Food allergy 08/02/2018  . Mild intermittent asthma 08/02/2018    Past Surgical History:  Procedure Laterality Date  . APPENDECTOMY    . THYROIDECTOMY    . TUBAL LIGATION       OB History   No obstetric history on file.     Family History  Problem Relation Age of Onset  . Allergic rhinitis Neg Hx   . Asthma Neg Hx     Social History   Tobacco Use  . Smoking status: Never Smoker  . Smokeless tobacco: Never Used  Vaping Use  . Vaping Use: Never used  Substance Use Topics  . Alcohol use: Yes  . Drug use: No    Home Medications Prior to Admission medications   Medication Sig Start Date End Date Taking? Authorizing  Provider  albuterol (PROVENTIL HFA;VENTOLIN HFA) 108 (90 Base) MCG/ACT inhaler Inhale into the lungs. 10/17/18   [provider]  diphenhydrAMINE (BENADRYL) 25 MG tablet Take 1 tablet (25 mg total) by mouth every 6 (six) hours. 07/12/19   Harlene Salts A, PA-C  EPINEPHrine (EPIPEN 2-PAK) 0.3 mg/0.3 mL IJ SOAJ injection Inject 0.3 mLs (0.3 mg total) into the muscle once as needed for up to 1 dose. 08/02/18   Bobbitt, Heywood Iles, MD  famotidine (PEPCID) 20 MG tablet Take 1 tablet (20 mg total) by mouth 2 (two) times daily. 07/12/19   Harlene Salts A, PA-C  hydrOXYzine (ATARAX/VISTARIL) 25 MG tablet  10/20/18   [provider]  levocetirizine (XYZAL) 5 MG tablet  10/21/18   [provider]  levothyroxine (SYNTHROID, LEVOTHROID) 112 MCG tablet Take 112 mcg by mouth daily. 04/19/18   [provider]  montelukast (SINGULAIR) 10 MG tablet Take 1 tablet (10 mg total) by mouth at bedtime. 08/02/18   Bobbitt, Heywood Iles, MD  omalizumab [name]) 150 MG injection Inject 300 mg into the skin every 14 (fourteen) days. 03/20/20   Bobbitt, Heywood Iles, MD  predniSONE (STERAPRED UNI-PAK 21 TAB) 10 MG (21) TBPK tablet FPD 07/23/18   [provider]  triamcinolone cream (KENALOG) 0.5 % APP EXT AA BID PRF RASH 10/22/18  [provider]    Allergies    Amoxicillin and Shellfish allergy  Review of Systems   Review of Systems  Constitutional: Negative for diaphoresis and fever.  Eyes: Negative for redness.  Respiratory: Positive for shortness of breath. Negative for cough.   Cardiovascular: Positive for chest pain and leg swelling (Chronic unchanged). Negative for palpitations.  Gastrointestinal: Positive for abdominal pain and nausea. Negative for constipation, diarrhea and vomiting.  Genitourinary: Negative for dysuria.  Musculoskeletal: Negative for back pain and neck pain.  Skin: Negative for rash.  Neurological: Negative for syncope and light-headedness.   Psychiatric/Behavioral: The patient is not nervous/anxious.     Physical Exam Updated Vital Signs BP (!) 123/91 (BP Location: Left Arm)   Pulse 73   Temp 98.8 F (37.1 C) (Oral)   Resp 20   Ht 5\' 11"  (1.803 m)   Wt 130 kg   LMP 07/25/2020   SpO2 98%   BMI 39.97 kg/m   Physical Exam Vitals and nursing note reviewed.  Constitutional:      Appearance: She is well-developed and well-nourished. She is not diaphoretic.  HENT:     Head: Normocephalic and atraumatic.     Mouth/Throat:     Mouth: Mucous membranes are normal. Mucous membranes are not dry.  Eyes:     Conjunctiva/sclera: Conjunctivae normal.  Neck:     Vascular: Normal carotid pulses. No carotid bruit or JVD.     Trachea: Trachea normal. No tracheal deviation.  Cardiovascular:     Rate and Rhythm: Normal rate and regular rhythm.     Pulses: Intact distal pulses. No decreased pulses.     Heart sounds: Normal heart sounds, S1 normal and S2 normal. No murmur heard.     Comments: No murmur Pulmonary:     Effort: Pulmonary effort is normal. No respiratory distress.     Breath sounds: No wheezing.     Comments: No wheezing heard.  Lung sounds clear to auscultation bilaterally Chest:     Chest wall: No tenderness.  Abdominal:     General: Bowel sounds are normal. Aorta is normal.     Palpations: Abdomen is soft.     Tenderness: There is no abdominal tenderness. There is no guarding or rebound.  Musculoskeletal:        General: Normal range of motion.     Cervical back: Normal range of motion and neck supple. No muscular tenderness.     Right lower leg: Edema present.     Left lower leg: Edema present.     Comments: Trace edema to bilateral ankles, symmetric  Skin:    General: Skin is warm and dry.     Coloration: Skin is not pale.     Nails: There is no cyanosis.  Neurological:     Mental Status: She is alert.  Psychiatric:        Mood and Affect: Mood and affect normal.     ED Results / Procedures /  Treatments   Labs (all labs ordered are listed, but only abnormal results are displayed) Labs Reviewed  BASIC METABOLIC PANEL - Abnormal; Notable for the following components:      Result Value   Glucose, Bld 105 (*)    Calcium 8.5 (*)    All other components within normal limits  CBC - Abnormal; Notable for the following components:   RBC 3.74 (*)    Hemoglobin 11.3 (*)    HCT 34.4 (*)    All other components within  normal limits  HEPATIC FUNCTION PANEL - Abnormal; Notable for the following components:   Albumin 3.1 (*)    All other components within normal limits  D-DIMER, QUANTITATIVE (NOT AT [facility]) - Abnormal; Notable for the following components:   D-Dimer, Quant 0.86 (*)    All other components within normal limits  PROTIME-INR  LIPASE, BLOOD  I-STAT BETA HCG BLOOD, ED ([facility], [facility], [facility] ONLY)  TROPONIN I (HIGH SENSITIVITY)  TROPONIN I (HIGH SENSITIVITY)    ED ECG REPORT   Date: 09/03/2020  Rate: 71  Rhythm: normal sinus rhythm  QRS Axis: normal  Intervals: normal  ST/T Wave abnormalities: normal  Conduction Disutrbances:none  Narrative Interpretation:   Old EKG Reviewed: unchanged  I have personally reviewed the EKG tracing and agree with the computerized printout as noted.  Radiology DG Chest 2 View  Result Date: 09/03/2020 CLINICAL DATA:  Chest pain EXAM: CHEST - 2 VIEW COMPARISON:  05/03/2018 FINDINGS: Normal heart size and mediastinal contours. Artifact from EKG pads. No acute infiltrate or edema. No effusion or pneumothorax. No acute osseous findings. IMPRESSION: No active cardiopulmonary disease. Electronically Signed   By: Marnee Spring M.D.   On: 09/03/2020 07:31    Procedures Procedures   Medications Ordered in ED Medications  iohexol (OMNIPAQUE) 350 MG/ML injection 75 mL (75 mLs Intravenous Contrast Given 09/03/20 0160)    ED Course  I have reviewed the triage vital signs and the nursing notes.  Pertinent labs & imaging results that were available  during my care of the patient were reviewed by me and considered in my medical decision making (see chart for details).  Patient seen and examined. Patient up at sink when I walked in room. No respiratory distress. Work-up initiated. Exam is unremarkable, she does look a bit anxious. Will check d-dimer, await troponins, added hepatic function panel and lipase.   Vital signs reviewed and are as follows: BP (!) 123/91 (BP Location: Left Arm)   Pulse 73   Temp 98.8 F (37.1 C) (Oral)   Resp 20   Ht 5\' 11"  (1.803 m)   Wt 130 kg   LMP 07/25/2020   SpO2 98%   BMI 39.97 kg/m   Labs reviewed.  D-dimer elevated.  Patient updated on need for CT and agrees to proceed.  Other labs are reassuring.  Patient informed of mild anemia, states that she has had this in the past.  During ED stay, patient has been stable.  CT performed and reviewed personally.  No signs of pneumonia or PE.  I reviewed imaging results at bedside with the patient and her husband who is now present.  She has pulmonology follow-up tomorrow with [facility] chest.  She is encouraged to keep this appointment.  At this point, she will continue home treatment for asthma.  On reexam, she has not developed any wheezing or abnormal lung sounds.  Do not feel that she requires prednisone at this point but will defer to pulmonology tomorrow.  Vital signs remained within normal limits and she is satting 100% on room air without tachycardia.  She is comfortable with discharge home at this time.  Patient urged to return with worsening symptoms or other concerns. Patient verbalized understanding and agrees with plan.       MDM Rules/Calculators/A&P                          Patient presents the emergency department today for evaluation of chest pain and shortness  of breath.  Low concern for ACS at this time.  Troponin negative x2.  Chest x-ray is clear.  EKG without ischemic findings.  Patient was evaluated for pulmonary embolism.  Her D-dimer was  mildly elevated therefore CT angiography of the chest was performed.  This was negative.  Patient is not hypoxic, tachycardic or tachypneic.  She is not in any distress.  She does have underlying asthma but no concurrent wheezing.  Fortunately she has an appointment to establish care with pulmonology tomorrow which I think will be beneficial.  Return directions as above.   Final Clinical Impression(s) / ED Diagnoses Final diagnoses:  Precordial pain  Shortness of breath    Rx / DC Orders ED Discharge Orders    None       Renne CriglerGeiple, Jonnie Kubly, PA-C 09/03/20 1311    Geoffery Lyonselo, Douglas, MD 09/03/20 1521

## 2020-09-03 NOTE — ED Triage Notes (Signed)
Patient reports central chest and upper abdominal pain onset yesterday with mid SOB , no emesis or diaphoresis . Denies cough or fever .

## 2020-09-03 NOTE — Discharge Instructions (Signed)
Please read and follow all provided instructions.  Your diagnoses today include:  1. Precordial pain   2. Shortness of breath     Tests performed today include:  An EKG of your heart  A chest x-ray  Cardiac enzymes - a blood test for heart muscle damage  Blood counts and electrolytes - mild anemia  D-dimer - was elevated  Blood test for liver and pancreas - were norma  CT of the chest -was completely normal without signs of blood clot in the lungs or changes to the lung  Vital signs. See below for your results today.   Medications prescribed:   None  Take any prescribed medications only as directed.  Follow-up instructions: Please follow-up with your primary care provider as soon as you can for further evaluation of your symptoms.   Return instructions:  SEEK IMMEDIATE MEDICAL ATTENTION IF:  You have severe chest pain, especially if the pain is crushing or pressure-like and spreads to the arms, back, neck, or jaw, or if you have sweating, nausea (feeling sick to your stomach), or shortness of breath. THIS IS AN EMERGENCY. Don't wait to see if the pain will go away. Get medical help at once. Call 911 or 0 (operator). DO NOT drive yourself to the hospital.   Your chest pain gets worse and does not go away with rest.   You have an attack of chest pain lasting longer than usual, despite rest and treatment with the medications your caregiver has prescribed.   You wake from sleep with chest pain or shortness of breath.  You feel dizzy or faint.  You have chest pain not typical of your usual pain for which you originally saw your caregiver.   You have any other emergent concerns regarding your health.  Additional Information: Chest pain comes from many different causes. Your caregiver has diagnosed you as having chest pain that is not specific for one problem, but does not require admission.  You are at low risk for an acute heart condition or other serious illness.    Your vital signs today were: BP (!) 133/102   Pulse 72   Temp 98.8 F (37.1 C) (Oral)   Resp 17   Ht 5\' 11"  (1.803 m)   Wt 130 kg   LMP 07/25/2020   SpO2 100%   BMI 39.97 kg/m  If your blood pressure (BP) was elevated above 135/85 this visit, please have this repeated by your doctor within one month. --------------

## 2020-09-04 ENCOUNTER — Other Ambulatory Visit: Payer: Self-pay

## 2020-09-04 ENCOUNTER — Encounter: Payer: Self-pay | Admitting: Emergency Medicine

## 2020-09-04 ENCOUNTER — Emergency Department

## 2020-09-04 ENCOUNTER — Emergency Department
Admission: EM | Admit: 2020-09-04 | Discharge: 2020-09-05 | Disposition: A | Discharge status: Home / Self Care | Attending: Emergency Medicine | Admitting: Emergency Medicine

## 2020-09-04 DIAGNOSIS — M79661 Pain in right lower leg: Secondary | ICD-10-CM | POA: Diagnosis not present

## 2020-09-04 DIAGNOSIS — J4520 Mild intermittent asthma, uncomplicated: Secondary | ICD-10-CM | POA: Insufficient documentation

## 2020-09-04 DIAGNOSIS — Z79899 Other long term (current) drug therapy: Secondary | ICD-10-CM | POA: Diagnosis not present

## 2020-09-04 DIAGNOSIS — E039 Hypothyroidism, unspecified: Secondary | ICD-10-CM | POA: Diagnosis not present

## 2020-09-04 DIAGNOSIS — R079 Chest pain, unspecified: Secondary | ICD-10-CM | POA: Insufficient documentation

## 2020-09-04 DIAGNOSIS — R0602 Shortness of breath: Secondary | ICD-10-CM | POA: Diagnosis not present

## 2020-09-04 DIAGNOSIS — M79662 Pain in left lower leg: Secondary | ICD-10-CM | POA: Diagnosis not present

## 2020-09-04 DIAGNOSIS — M79605 Pain in left leg: Secondary | ICD-10-CM

## 2020-09-04 DIAGNOSIS — M79604 Pain in right leg: Secondary | ICD-10-CM

## 2020-09-04 LAB — BASIC METABOLIC PANEL
Anion gap: 9 (ref 5–15)
BUN: 13 mg/dL (ref 6–20)
CO2: 22 mmol/L (ref 22–32)
Calcium: 8.3 mg/dL — ABNORMAL LOW (ref 8.9–10.3)
Chloride: 106 mmol/L (ref 98–111)
Creatinine, Ser: 0.84 mg/dL (ref 0.44–1.00)
GFR, Estimated: >60 mL/min (ref >60)
Glucose, Bld: 109 mg/dL — ABNORMAL HIGH (ref 70–99)
Potassium: 3.9 mmol/L (ref 3.5–5.1)
Sodium: 137 mmol/L (ref 135–145)

## 2020-09-04 LAB — CBC
HCT: 34.2 % — ABNORMAL LOW (ref 36.0–46.0)
Hemoglobin: 11.2 g/dL — ABNORMAL LOW (ref 12.0–15.0)
MCH: 29.9 pg (ref 26.0–34.0)
MCHC: 32.7 g/dL (ref 30.0–36.0)
MCV: 91.4 fL (ref 80.0–100.0)
Platelets: 310 10*3/uL (ref 150–400)
RBC: 3.74 MIL/uL — ABNORMAL LOW (ref 3.87–5.11)
RDW: 12.6 % (ref 11.5–15.5)
WBC: 5.8 10*3/uL (ref 4.0–10.5)
nRBC: 0 % (ref 0.0–0.2)

## 2020-09-04 LAB — TROPONIN I (HIGH SENSITIVITY): Troponin I (High Sensitivity): <2 ng/L (ref <18)

## 2020-09-04 MED ORDER — FAMOTIDINE 20 MG PO TABS: 20.0000 mg | ORAL_TABLET | Freq: Every day | ORAL | 1 refills | Status: DC

## 2020-09-04 MED ORDER — LIDOCAINE VISCOUS HCL 2 % MT SOLN
15.0000 mL | Freq: Once | OROMUCOSAL | Status: AC
  Administered 2020-09-04: 15 mL via ORAL
  Filled 2020-09-04: qty 15

## 2020-09-04 MED ORDER — ALUM & MAG HYDROXIDE-SIMETH 200-200-20 MG/5ML PO SUSP
30.0000 mL | Freq: Once | ORAL | Status: AC
  Administered 2020-09-04: 30 mL via ORAL
  Filled 2020-09-04: qty 30

## 2020-09-04 NOTE — ED Provider Notes (Signed)
[facility] Emergency Department Provider Note   ____________________________________________   I have reviewed the triage vital signs and the nursing notes.   HISTORY  Chief Complaint Elevated d-dimer concern  History limited by: Not Limited   HPI Marie Jimenez is a 45 y.o. female who presents to the emergency department today with primary concern for elevated d-dimer that was found yesterday which has not been found. She went to a different ER yesterday for chest pain and shortness of breath that started 2 days ago. The patient had a work up performed including a d-dimer which was elevated. Had a CT angio done which did not show any pulmonary embolism. She is now concerned that she might have lower extremity blood clots. She voices concern that her mother had blood clots and a friend also recently had a blood clot. She says she has been having lower extremity pain and swelling.     Records reviewed. Per medical record review patient has a history of asthma.   Past Medical History:  Diagnosis Date  . Asthma   . Hypothyroid     Patient Active Problem List   Diagnosis Date Noted  . Chronic idiopathic urticaria 08/02/2018  . Food allergy 08/02/2018  . Mild intermittent asthma 08/02/2018    Past Surgical History:  Procedure Laterality Date  . APPENDECTOMY    . THYROIDECTOMY    . TUBAL LIGATION      Prior to Admission medications   Medication Sig Start Date End Date Taking? Authorizing Provider  albuterol (PROVENTIL HFA;VENTOLIN HFA) 108 (90 Base) MCG/ACT inhaler Inhale into the lungs. 10/17/18   [provider]  diphenhydrAMINE (BENADRYL) 25 MG tablet Take 1 tablet (25 mg total) by mouth every 6 (six) hours. 07/12/19   Harlene Salts A, PA-C  EPINEPHrine (EPIPEN 2-PAK) 0.3 mg/0.3 mL IJ SOAJ injection Inject 0.3 mLs (0.3 mg total) into the muscle once as needed for up to 1 dose. 08/02/18   Bobbitt, Heywood Iles, MD  famotidine (PEPCID) 20  MG tablet Take 1 tablet (20 mg total) by mouth 2 (two) times daily. 07/12/19   Harlene Salts A, PA-C  hydrOXYzine (ATARAX/VISTARIL) 25 MG tablet  10/20/18   [provider]  levocetirizine (XYZAL) 5 MG tablet  10/21/18   [provider]  levothyroxine (SYNTHROID, LEVOTHROID) 112 MCG tablet Take 112 mcg by mouth daily. 04/19/18   [provider]  montelukast (SINGULAIR) 10 MG tablet Take 1 tablet (10 mg total) by mouth at bedtime. 08/02/18   Bobbitt, Heywood Iles, MD  omalizumab [name]) 150 MG injection Inject 300 mg into the skin every 14 (fourteen) days. 03/20/20   Bobbitt, Heywood Iles, MD  predniSONE (STERAPRED UNI-PAK 21 TAB) 10 MG (21) TBPK tablet FPD 07/23/18   [provider]  triamcinolone cream (KENALOG) 0.5 % APP EXT AA BID PRF RASH 10/22/18   [provider]    Allergies Amoxicillin and Shellfish allergy  Family History  Problem Relation Age of Onset  . Allergic rhinitis Neg Hx   . Asthma Neg Hx     Social History Social History   Tobacco Use  . Smoking status: Never Smoker  . Smokeless tobacco: Never Used  Vaping Use  . Vaping Use: Never used  Substance Use Topics  . Alcohol use: Yes  . Drug use: No    Review of Systems Constitutional: No fever/chills Eyes: No visual changes. ENT: No sore throat. Cardiovascular: Positive for chest pain. Respiratory: Positive for shortness of breath. Gastrointestinal: No abdominal  pain.  No nausea, no vomiting.  No diarrhea.   Genitourinary: Negative for dysuria. Musculoskeletal: Positive for leg pain. Skin: Negative for rash. Neurological: Negative for headaches, focal weakness or numbness.  ____________________________________________   PHYSICAL EXAM:  VITAL SIGNS: ED Triage Vitals  Enc Vitals Group     BP 09/04/20 1859 (!) 135/108     Pulse Rate 09/04/20 1859 (!) 139     Resp 09/04/20 1859 (!) 22     Temp 09/04/20 1859 98.4 F (36.9 C)     Temp Source 09/04/20 1859 Oral      SpO2 --      Weight 09/04/20 1856 286 lb 9.6 oz (130 kg)     Height 09/04/20 1856 5\' 11"  (1.803 m)     Head Circumference --      Peak Flow --      Pain Score 09/04/20 1856 8   Constitutional: Alert and oriented.  Eyes: Conjunctivae are normal.  ENT      Head: Normocephalic and atraumatic.      Nose: No congestion/rhinnorhea.      Mouth/Throat: Mucous membranes are moist.      Neck: No stridor. Hematological/Lymphatic/Immunilogical: No cervical lymphadenopathy. Cardiovascular: Normal rate, regular rhythm.  No murmurs, rubs, or gallops.  Respiratory: Normal respiratory effort without tachypnea nor retractions. Breath sounds are clear and equal bilaterally. No wheezes/rales/rhonchi. Gastrointestinal: Soft and non tender. No rebound. No guarding.  Genitourinary: Deferred Musculoskeletal: Normal range of motion in all extremities. No lower extremity edema. Neurologic:  Normal speech and language. No gross focal neurologic deficits are appreciated.  Skin:  Skin is warm, dry and intact. No rash noted. Psychiatric: Anxious appearing.   ____________________________________________    LABS (pertinent positives/negatives)  Trop hs <2 BMP wnl except glu 109, ca 8.3 CBC wbc 5.8, hgb 11.2, plt 310 ____________________________________________   EKG  I, 09/06/20, attending physician, personally viewed and interpreted this EKG  EKG Time: 1846 Rate: 87 Rhythm: normal sinus rhythm Axis: normal Intervals: qtc 459 QRS: narrow ST changes: no st elevation Impression: abnormal ekg   ____________________________________________    RADIOLOGY  CXR Negative radiographs of the chest  Phineas Semen venous lower extremities No DVT ____________________________________________   PROCEDURES  Procedures  ____________________________________________   INITIAL IMPRESSION / ASSESSMENT AND PLAN / ED COURSE  Pertinent labs & imaging results that were available during my care of the  patient were reviewed by me and considered in my medical decision making (see chart for details).   Patient presented to the emergency department today with concerns for possible DVT.  Patient had a recent elevated D-dimer and is complaining of some leg pain.  Ultrasound here did not show any DVT.  I did try a GI cocktail given that the patient was still complaining of chest pain.  Patient had a work-up for this couple of days ago with a negative CT angio.  Troponin was negative today.  She states she felt like she got some relief with GI cocktail.  Did discuss the possibility of GERD with the patient.  Will discharge with antiacid and dietary guidelines. Discussed importance of follow up.  ____________________________________________   FINAL CLINICAL IMPRESSION(S) / ED DIAGNOSES  Final diagnoses:  Nonspecific chest pain  Pain in both lower extremities     Note: This dictation was prepared with Dragon dictation. Any transcriptional errors that result from this process are unintentional     [place], MD 09/04/20 2345

## 2020-09-04 NOTE — Discharge Instructions (Addendum)
Please seek medical attention for any high fevers, chest pain, shortness of breath, change in behavior, persistent vomiting, bloody stool or any other new or concerning symptoms.  

## 2020-09-04 NOTE — ED Triage Notes (Signed)
Pt comes into the ED via POV c/o central chest pain and SHOB.  Pt states she was seen yesterday for there same thing.  Pt presents with increased work of breathing.  Pt ambulatory to triage.  Pt states that they couldn't find anything wrong last night when she was seen.  She did have an elevated d-dimer but CTA was negative.  They did not complete a [place] in the legs.  Pt states she has been using her asthma medications with no relief.

## 2020-09-06 ENCOUNTER — Other Ambulatory Visit: Payer: Self-pay

## 2020-09-06 ENCOUNTER — Ambulatory Visit (INDEPENDENT_AMBULATORY_CARE_PROVIDER_SITE_OTHER)

## 2020-09-06 DIAGNOSIS — L501 Idiopathic urticaria: Secondary | ICD-10-CM

## 2020-09-27 ENCOUNTER — Ambulatory Visit (INDEPENDENT_AMBULATORY_CARE_PROVIDER_SITE_OTHER): Admitting: *Deleted

## 2020-09-27 DIAGNOSIS — L501 Idiopathic urticaria: Secondary | ICD-10-CM

## 2020-10-18 ENCOUNTER — Ambulatory Visit: Payer: Self-pay

## 2020-10-22 ENCOUNTER — Other Ambulatory Visit: Payer: Self-pay

## 2020-10-22 ENCOUNTER — Ambulatory Visit (INDEPENDENT_AMBULATORY_CARE_PROVIDER_SITE_OTHER): Admitting: *Deleted

## 2020-10-22 DIAGNOSIS — L501 Idiopathic urticaria: Secondary | ICD-10-CM | POA: Diagnosis not present

## 2020-11-12 ENCOUNTER — Ambulatory Visit: Payer: Self-pay

## 2020-11-12 ENCOUNTER — Ambulatory Visit (INDEPENDENT_AMBULATORY_CARE_PROVIDER_SITE_OTHER): Admitting: *Deleted

## 2020-11-12 ENCOUNTER — Other Ambulatory Visit: Payer: Self-pay

## 2020-11-12 DIAGNOSIS — L501 Idiopathic urticaria: Secondary | ICD-10-CM | POA: Diagnosis not present

## 2020-11-14 ENCOUNTER — Ambulatory Visit: Payer: Self-pay

## 2020-12-03 ENCOUNTER — Ambulatory Visit (INDEPENDENT_AMBULATORY_CARE_PROVIDER_SITE_OTHER): Admitting: *Deleted

## 2020-12-03 ENCOUNTER — Other Ambulatory Visit: Payer: Self-pay

## 2020-12-03 DIAGNOSIS — L501 Idiopathic urticaria: Secondary | ICD-10-CM

## 2020-12-18 ENCOUNTER — Emergency Department

## 2020-12-18 ENCOUNTER — Other Ambulatory Visit: Payer: Self-pay

## 2020-12-18 ENCOUNTER — Emergency Department
Admission: EM | Admit: 2020-12-18 | Discharge: 2020-12-18 | Disposition: A | Discharge status: Home / Self Care | Attending: Emergency Medicine | Admitting: Emergency Medicine

## 2020-12-18 DIAGNOSIS — R202 Paresthesia of skin: Secondary | ICD-10-CM | POA: Insufficient documentation

## 2020-12-18 DIAGNOSIS — E039 Hypothyroidism, unspecified: Secondary | ICD-10-CM | POA: Insufficient documentation

## 2020-12-18 DIAGNOSIS — R42 Dizziness and giddiness: Secondary | ICD-10-CM | POA: Diagnosis not present

## 2020-12-18 DIAGNOSIS — H538 Other visual disturbances: Secondary | ICD-10-CM | POA: Insufficient documentation

## 2020-12-18 DIAGNOSIS — J45909 Unspecified asthma, uncomplicated: Secondary | ICD-10-CM | POA: Diagnosis not present

## 2020-12-18 DIAGNOSIS — Z79899 Other long term (current) drug therapy: Secondary | ICD-10-CM | POA: Insufficient documentation

## 2020-12-18 DIAGNOSIS — R079 Chest pain, unspecified: Secondary | ICD-10-CM | POA: Insufficient documentation

## 2020-12-18 LAB — BASIC METABOLIC PANEL
Anion gap: 8 (ref 5–15)
BUN: 10 mg/dL (ref 6–20)
CO2: 22 mmol/L (ref 22–32)
Calcium: 8.3 mg/dL — ABNORMAL LOW (ref 8.9–10.3)
Chloride: 106 mmol/L (ref 98–111)
Creatinine, Ser: 0.69 mg/dL (ref 0.44–1.00)
GFR, Estimated: >60 mL/min (ref >60)
Glucose, Bld: 74 mg/dL (ref 70–99)
Potassium: 3.9 mmol/L (ref 3.5–5.1)
Sodium: 136 mmol/L (ref 135–145)

## 2020-12-18 LAB — CBC
HCT: 35.4 % — ABNORMAL LOW (ref 36.0–46.0)
Hemoglobin: 11.8 g/dL — ABNORMAL LOW (ref 12.0–15.0)
MCH: 29.9 pg (ref 26.0–34.0)
MCHC: 33.3 g/dL (ref 30.0–36.0)
MCV: 89.8 fL (ref 80.0–100.0)
Platelets: 315 10*3/uL (ref 150–400)
RBC: 3.94 MIL/uL (ref 3.87–5.11)
RDW: 12.1 % (ref 11.5–15.5)
WBC: 5.5 10*3/uL (ref 4.0–10.5)
nRBC: 0 % (ref 0.0–0.2)

## 2020-12-18 LAB — TROPONIN I (HIGH SENSITIVITY)
Troponin I (High Sensitivity): <2 ng/L (ref <18)
Troponin I (High Sensitivity): 2 ng/L (ref <18)

## 2020-12-18 MED ORDER — MECLIZINE HCL 25 MG PO TABS
25.0000 mg | ORAL_TABLET | Freq: Once | ORAL | Status: AC
  Administered 2020-12-18: 25 mg via ORAL
  Filled 2020-12-18: qty 1

## 2020-12-18 MED ORDER — LORAZEPAM 2 MG/ML IJ SOLN
1.0000 mg | Freq: Once | INTRAMUSCULAR | Status: AC
  Administered 2020-12-18: 1 mg via INTRAVENOUS
  Filled 2020-12-18: qty 1

## 2020-12-18 MED ORDER — MECLIZINE HCL 50 MG PO TABS: ORAL_TABLET | ORAL | 0 refills | Status: DC

## 2020-12-18 MED ORDER — PREDNISONE 10 MG PO TABS: 30.0000 mg | ORAL_TABLET | Freq: Every day | ORAL | 0 refills | Status: DC

## 2020-12-18 MED ORDER — GADOBUTROL 1 MMOL/ML IV SOLN
10.0000 mL | Freq: Once | INTRAVENOUS | Status: AC | PRN
  Administered 2020-12-18: 10 mL via INTRAVENOUS

## 2020-12-18 NOTE — ED Notes (Signed)
Patient transported to MRI 

## 2020-12-18 NOTE — Discharge Instructions (Signed)
Follow-up with your regular doctor if not improving in 2 to 3 days.  Return to emergency department worsening.  Take the meclizine as prescribed.  Prednisone 30 mg daily for 3 days.  This may help open the eustachian tube to let the fluid drained from your inner ear.

## 2020-12-18 NOTE — ED Provider Notes (Signed)
[facility] Emergency Department Provider Note  ____________________________________________   Event Date/Time   First MD Initiated Contact with Patient 12/18/20 1634     (approximate)  I have reviewed the triage vital signs and the nursing notes.   HISTORY  Chief Complaint Chest Pain    HPI Marie Jimenez is a 45 y.o. female presents emergency department complaining of chest pain, dizziness and blurred vision.  Numbness in the left arm and left leg.  Patient thought it was her inner ear but symptoms have worsened.  No slurred speech.  States she had a visual disturbance in which the rubber bands look like they were moving.  States "I feel like I am high without having done any drugs "states that just really fell off    Past Medical History:  Diagnosis Date  . Asthma   . Hypothyroid     Patient Active Problem List   Diagnosis Date Noted  . Chronic idiopathic urticaria 08/02/2018  . Food allergy 08/02/2018  . Mild intermittent asthma 08/02/2018    Past Surgical History:  Procedure Laterality Date  . APPENDECTOMY    . THYROIDECTOMY    . TUBAL LIGATION      Prior to Admission medications   Medication Sig Start Date End Date Taking? Authorizing Provider  meclizine (ANTIVERT) 50 MG tablet Take 1/2 to 1 tablet 3 times daily for dizziness as needed, beware sedation 12/18/20  Yes Mykenna Viele, Roselyn Bering, PA-C  predniSONE (DELTASONE) 10 MG tablet Take 3 tablets (30 mg total) by mouth daily with breakfast. 12/18/20  Yes Lesleigh Hughson, Roselyn Bering, PA-C  albuterol (PROVENTIL HFA;VENTOLIN HFA) 108 (90 Base) MCG/ACT inhaler Inhale into the lungs. 10/17/18   [provider]  diphenhydrAMINE (BENADRYL) 25 MG tablet Take 1 tablet (25 mg total) by mouth every 6 (six) hours. 07/12/19   Harlene Salts A, PA-C  EPINEPHrine (EPIPEN 2-PAK) 0.3 mg/0.3 mL IJ SOAJ injection Inject 0.3 mLs (0.3 mg total) into the muscle once as needed for up to 1 dose. 08/02/18   Bobbitt, Heywood Iles, MD  famotidine (PEPCID) 20 MG tablet Take 1 tablet (20 mg total) by mouth 2 (two) times daily. 07/12/19   Harlene Salts A, PA-C  famotidine (PEPCID) 20 MG tablet Take 1 tablet (20 mg total) by mouth daily. 09/04/20 09/04/21  Phineas Semen, MD  hydrOXYzine (ATARAX/VISTARIL) 25 MG tablet  10/20/18   [provider]  levocetirizine (XYZAL) 5 MG tablet  10/21/18   [provider]  levothyroxine (SYNTHROID, LEVOTHROID) 112 MCG tablet Take 112 mcg by mouth daily. 04/19/18   [provider]  montelukast (SINGULAIR) 10 MG tablet Take 1 tablet (10 mg total) by mouth at bedtime. 08/02/18   Bobbitt, Heywood Iles, MD  omalizumab [name]) 150 MG injection Inject 300 mg into the skin every 14 (fourteen) days. 03/20/20   Bobbitt, Heywood Iles, MD  triamcinolone cream (KENALOG) 0.5 % APP EXT AA BID PRF RASH 10/22/18   [provider]    Allergies Amoxicillin and Shellfish allergy  Family History  Problem Relation Age of Onset  . Allergic rhinitis Neg Hx   . Asthma Neg Hx     Social History Social History   Tobacco Use  . Smoking status: Never Smoker  . Smokeless tobacco: Never Used  Vaping Use  . Vaping Use: Never used  Substance Use Topics  . Alcohol use: Not Currently  . Drug use: No    Review of Systems  Constitutional: No fever/chills Eyes: No visual changes.  Positive for visual disturbance ENT: No sore throat. Respiratory: Denies cough Cardiovascular: Denies chest pain Gastrointestinal: Denies abdominal pain Genitourinary: Negative for dysuria. Musculoskeletal: Negative for back pain. Neurological: Positive for left-sided weakness Skin: Negative for rash. Psychiatric: no mood changes,     ____________________________________________   PHYSICAL EXAM:  VITAL SIGNS: ED Triage Vitals  Enc Vitals Group     BP 12/18/20 1446 (!) 141/86     Pulse Rate 12/18/20 1446 73     Resp 12/18/20 1446 19     Temp 12/18/20 1446 98 F (36.7 C)      Temp src --      SpO2 12/18/20 1446 99 %     Weight --      Height --      Head Circumference --      Peak Flow --      Pain Score 12/18/20 1444 6     Pain Loc --      Pain Edu? --      Excl. in GC? --     Constitutional: Alert and oriented. Well appearing and in no acute distress. Eyes: Conjunctivae are normal.  PERRL, EOMI, no nystagmus noted Head: Atraumatic. Ears: TMs are dull bilaterally Nose: No congestion/rhinnorhea. Mouth/Throat: Mucous membranes are moist.   Neck:  supple no lymphadenopathy noted Cardiovascular: Normal rate, regular rhythm. Heart sounds are normal Respiratory: Normal respiratory effort.  No retractions, lungs c t a  GU: deferred Musculoskeletal: FROM all extremities, warm and well perfused Neurologic:  Normal speech and language.  Patient misses her nose with the finger-to-nose with head leaning back.  Positive Romberg test, grip is decreased in the left hand when compared to the right Skin:  Skin is warm, dry and intact. No rash noted. Psychiatric: Mood and affect are normal. Speech and behavior are normal.  ____________________________________________   LABS (all labs ordered are listed, but only abnormal results are displayed)  Labs Reviewed  BASIC METABOLIC PANEL - Abnormal; Notable for the following components:      Result Value   Calcium 8.3 (*)    All other components within normal limits  CBC - Abnormal; Notable for the following components:   Hemoglobin 11.8 (*)    HCT 35.4 (*)    All other components within normal limits  POC URINE PREG, ED  TROPONIN I (HIGH SENSITIVITY)  TROPONIN I (HIGH SENSITIVITY)   ____________________________________________   ____________________________________________  RADIOLOGY  CT of the head MRI of the brain with and without contrast  ____________________________________________   PROCEDURES  Procedure(s) performed: EKG shows normal sinus rhythm, see physician  read  Procedures    ____________________________________________   INITIAL IMPRESSION / ASSESSMENT AND PLAN / ED COURSE  Pertinent labs & imaging results that were available during my care of the patient were reviewed by me and considered in my medical decision making (see chart for details).   Patient is a 45 year old female presents with chest pain, dizziness, blurred vision and visual disturbance.  Some numbness into the left arm and left leg.  States it feels weak.  Symptoms for 2 days.  Patient also complains of some shortness of breath but feels that maybe she was hyperventilating.  Physical exam shows patient to appear stable  DDx: CVA, TIA, vertigo, MI  Labs are reassuring, CBC and metabolic panel normal, first troponin is negative,  Chest x-ray reviewed by me confirmed by radiology to be normal  CT of the head reviewed by me confirmed by radiology to be negative  On recheck of the patient, she continues to have dizziness even after the Ativan  MRI of the brain with and without contrast ordered  MRI does not show any acute infarct  Did explain all the findings to the patient.  Feel this is mainly vertigo.  She will be given a prescription for Antivert and prednisone 30 mg daily for 3 days to open up the eustachian tube.  Return emergency department worsening.  Follow-up with your regular doctor if not better in 2 to 3 days.  She is discharged stable condition.     Odaly Peri was evaluated in Emergency Department on 12/18/2020 for the symptoms described in the history of present illness. She was evaluated in the context of the global COVID-19 pandemic, which necessitated consideration that the patient might be at risk for infection with the SARS-CoV-2 virus that causes COVID-19. Institutional protocols and algorithms that pertain to the evaluation of patients at risk for COVID-19 are in a state of rapid change based on information released by regulatory bodies including  the CDC and federal and state organizations. These policies and algorithms were followed during the patient's care in the ED.    As part of my medical decision making, I reviewed the following data within the electronic MEDICAL RECORD NUMBER Nursing notes reviewed and incorporated, Labs reviewed , EKG interpreted NSR, Old chart reviewed, Radiograph reviewed , Notes from prior ED visits and [place] Controlled Substance Database  ____________________________________________   FINAL CLINICAL IMPRESSION(S) / ED DIAGNOSES  Final diagnoses:  Dizziness      NEW MEDICATIONS STARTED DURING THIS VISIT:  New Prescriptions   MECLIZINE (ANTIVERT) 50 MG TABLET    Take 1/2 to 1 tablet 3 times daily for dizziness as needed, beware sedation   PREDNISONE (DELTASONE) 10 MG TABLET    Take 3 tablets (30 mg total) by mouth daily with breakfast.     Note:  This document was prepared using Dragon voice recognition software and may include unintentional dictation errors.    Faythe Ghee, PA-C 12/18/20 1939    Sharman Cheek, MD 12/19/20 [phone]

## 2020-12-18 NOTE — ED Triage Notes (Signed)
Pt comes with c/o CP, dizziness and blurred vision. Pt also states numbness in left arm and leg. Pt states this started two days ago.  Pt states left sided CP.  Pt states SOb that just started hour ago.

## 2020-12-18 NOTE — ED Notes (Signed)
Patient transported to CT 

## 2020-12-24 ENCOUNTER — Ambulatory Visit: Payer: Self-pay

## 2020-12-31 ENCOUNTER — Ambulatory Visit (INDEPENDENT_AMBULATORY_CARE_PROVIDER_SITE_OTHER): Admitting: *Deleted

## 2020-12-31 ENCOUNTER — Other Ambulatory Visit: Payer: Self-pay

## 2020-12-31 DIAGNOSIS — L501 Idiopathic urticaria: Secondary | ICD-10-CM

## 2021-01-22 ENCOUNTER — Ambulatory Visit (INDEPENDENT_AMBULATORY_CARE_PROVIDER_SITE_OTHER)

## 2021-01-22 ENCOUNTER — Other Ambulatory Visit: Payer: Self-pay

## 2021-01-22 DIAGNOSIS — L501 Idiopathic urticaria: Secondary | ICD-10-CM

## 2021-02-12 ENCOUNTER — Ambulatory Visit: Payer: Self-pay

## 2021-02-17 ENCOUNTER — Ambulatory Visit (INDEPENDENT_AMBULATORY_CARE_PROVIDER_SITE_OTHER)

## 2021-02-17 ENCOUNTER — Other Ambulatory Visit: Payer: Self-pay

## 2021-02-17 DIAGNOSIS — L501 Idiopathic urticaria: Secondary | ICD-10-CM

## 2021-03-10 ENCOUNTER — Other Ambulatory Visit: Payer: Self-pay

## 2021-03-10 ENCOUNTER — Ambulatory Visit (INDEPENDENT_AMBULATORY_CARE_PROVIDER_SITE_OTHER): Admitting: *Deleted

## 2021-03-10 DIAGNOSIS — L501 Idiopathic urticaria: Secondary | ICD-10-CM | POA: Diagnosis not present

## 2021-03-31 ENCOUNTER — Other Ambulatory Visit: Payer: Self-pay

## 2021-03-31 ENCOUNTER — Ambulatory Visit (INDEPENDENT_AMBULATORY_CARE_PROVIDER_SITE_OTHER): Admitting: *Deleted

## 2021-03-31 DIAGNOSIS — L501 Idiopathic urticaria: Secondary | ICD-10-CM

## 2021-04-21 ENCOUNTER — Other Ambulatory Visit: Payer: Self-pay

## 2021-04-21 ENCOUNTER — Ambulatory Visit (INDEPENDENT_AMBULATORY_CARE_PROVIDER_SITE_OTHER)

## 2021-04-21 DIAGNOSIS — L501 Idiopathic urticaria: Secondary | ICD-10-CM | POA: Diagnosis not present

## 2021-05-12 ENCOUNTER — Ambulatory Visit (INDEPENDENT_AMBULATORY_CARE_PROVIDER_SITE_OTHER)

## 2021-05-12 ENCOUNTER — Other Ambulatory Visit: Payer: Self-pay

## 2021-05-12 DIAGNOSIS — L501 Idiopathic urticaria: Secondary | ICD-10-CM | POA: Diagnosis not present

## 2021-05-13 ENCOUNTER — Telehealth: Payer: Self-pay | Admitting: *Deleted

## 2021-05-13 MED ORDER — XOLAIR 150 MG ~~LOC~~ SOLR: 300.0000 mg | SUBCUTANEOUS | 3 refills | Status: DC

## 2021-05-13 NOTE — Telephone Encounter (Signed)
Patient called and stated that the pharmacy called her to let her know that she does not have any refills remaining for her Xolair and to reach out to [place] to let us know.

## 2021-05-13 NOTE — Telephone Encounter (Signed)
Rx sent 

## 2021-06-02 ENCOUNTER — Ambulatory Visit

## 2021-06-03 ENCOUNTER — Ambulatory Visit (INDEPENDENT_AMBULATORY_CARE_PROVIDER_SITE_OTHER): Admitting: *Deleted

## 2021-06-03 ENCOUNTER — Other Ambulatory Visit: Payer: Self-pay

## 2021-06-03 DIAGNOSIS — L501 Idiopathic urticaria: Secondary | ICD-10-CM

## 2021-06-24 ENCOUNTER — Ambulatory Visit (INDEPENDENT_AMBULATORY_CARE_PROVIDER_SITE_OTHER)

## 2021-06-24 ENCOUNTER — Other Ambulatory Visit: Payer: Self-pay

## 2021-06-24 DIAGNOSIS — L501 Idiopathic urticaria: Secondary | ICD-10-CM | POA: Diagnosis not present

## 2021-07-15 ENCOUNTER — Ambulatory Visit

## 2021-07-16 ENCOUNTER — Ambulatory Visit (INDEPENDENT_AMBULATORY_CARE_PROVIDER_SITE_OTHER)

## 2021-07-16 ENCOUNTER — Other Ambulatory Visit: Payer: Self-pay

## 2021-07-16 DIAGNOSIS — L501 Idiopathic urticaria: Secondary | ICD-10-CM

## 2021-08-05 ENCOUNTER — Other Ambulatory Visit: Payer: Self-pay

## 2021-08-05 ENCOUNTER — Ambulatory Visit (INDEPENDENT_AMBULATORY_CARE_PROVIDER_SITE_OTHER)

## 2021-08-05 DIAGNOSIS — L501 Idiopathic urticaria: Secondary | ICD-10-CM

## 2021-08-26 ENCOUNTER — Ambulatory Visit

## 2021-08-28 ENCOUNTER — Other Ambulatory Visit: Payer: Self-pay

## 2021-08-28 ENCOUNTER — Ambulatory Visit (INDEPENDENT_AMBULATORY_CARE_PROVIDER_SITE_OTHER)

## 2021-08-28 DIAGNOSIS — L501 Idiopathic urticaria: Secondary | ICD-10-CM | POA: Diagnosis not present

## 2021-09-18 ENCOUNTER — Ambulatory Visit

## 2021-09-23 ENCOUNTER — Other Ambulatory Visit: Payer: Self-pay

## 2021-09-23 ENCOUNTER — Ambulatory Visit (INDEPENDENT_AMBULATORY_CARE_PROVIDER_SITE_OTHER): Admitting: *Deleted

## 2021-09-23 DIAGNOSIS — L501 Idiopathic urticaria: Secondary | ICD-10-CM

## 2021-10-14 ENCOUNTER — Other Ambulatory Visit: Payer: Self-pay

## 2021-10-14 ENCOUNTER — Ambulatory Visit (INDEPENDENT_AMBULATORY_CARE_PROVIDER_SITE_OTHER)

## 2021-10-14 DIAGNOSIS — L501 Idiopathic urticaria: Secondary | ICD-10-CM

## 2021-11-04 ENCOUNTER — Ambulatory Visit (INDEPENDENT_AMBULATORY_CARE_PROVIDER_SITE_OTHER)

## 2021-11-04 DIAGNOSIS — L501 Idiopathic urticaria: Secondary | ICD-10-CM | POA: Diagnosis not present

## 2021-11-25 ENCOUNTER — Ambulatory Visit

## 2021-12-04 ENCOUNTER — Ambulatory Visit (INDEPENDENT_AMBULATORY_CARE_PROVIDER_SITE_OTHER)

## 2021-12-04 DIAGNOSIS — L501 Idiopathic urticaria: Secondary | ICD-10-CM | POA: Diagnosis not present

## 2021-12-08 ENCOUNTER — Ambulatory Visit (INDEPENDENT_AMBULATORY_CARE_PROVIDER_SITE_OTHER): Admitting: Allergy

## 2021-12-08 ENCOUNTER — Encounter: Payer: Self-pay | Admitting: Allergy

## 2021-12-08 VITALS — BP 128/78 | HR 74 | Temp 98.1°F | Resp 18 | Wt 282.5 lb

## 2021-12-08 DIAGNOSIS — L501 Idiopathic urticaria: Secondary | ICD-10-CM

## 2021-12-08 DIAGNOSIS — J4520 Mild intermittent asthma, uncomplicated: Secondary | ICD-10-CM | POA: Diagnosis not present

## 2021-12-08 DIAGNOSIS — T7800XD Anaphylactic reaction due to unspecified food, subsequent encounter: Secondary | ICD-10-CM

## 2021-12-08 DIAGNOSIS — T7800XA Anaphylactic reaction due to unspecified food, initial encounter: Secondary | ICD-10-CM

## 2021-12-08 MED ORDER — EPINEPHRINE 0.3 MG/0.3ML IJ SOAJ: 0.3000 mg | INTRAMUSCULAR | 2 refills | Status: DC | PRN

## 2021-12-08 NOTE — Patient Instructions (Addendum)
Hives: Continue Xolair 300mg  every 3-4 weeks.   If you have a flare:  Start zyrtec (cetirizine) 10mg  twice a day. If symptoms are not controlled or causes drowsiness let us know. Start pepcid (famotidine) 20mg  twice a day.  Avoid the following potential triggers: alcohol, tight clothing, NSAIDs, hot showers and getting overheated.  Asthma May use albuterol rescue inhaler 2 puffs every 4 to 6 hours as needed for shortness of breath, chest tightness, coughing, and wheezing. Monitor frequency of use.   Food allergy Continue strict avoidance of seafood. For mild symptoms you can take over the counter antihistamines such as Benadryl and monitor symptoms closely. If symptoms worsen or if you have severe symptoms including breathing issues, throat closure, significant swelling, whole body hives, severe diarrhea and vomiting, lightheadedness then inject epinephrine and seek immediate medical care afterwards.  Follow up in 6 months or sooner if needed.

## 2021-12-08 NOTE — Assessment & Plan Note (Signed)
Past history - 2020 skin testing was positive to shellfish and mollusks. Interim history - no reactions. . Continue strict avoidance of seafood. . For mild symptoms you can take over the counter antihistamines such as Benadryl and monitor symptoms closely. If symptoms worsen or if you have severe symptoms including breathing issues, throat closure, significant swelling, whole body hives, severe diarrhea and vomiting, lightheadedness then inject epinephrine and seek immediate medical care afterwards.

## 2021-12-08 NOTE — Assessment & Plan Note (Signed)
Doing well with Xolair 300mg  every 3 weeks. Had itching last time when she was 1 week late. Not taking daily antihistamines.  Continue Xolair 300mg  every 3-4 weeks.  During flares:   Start zyrtec (cetirizine) 10mg  twice a day.  If symptoms are not controlled or causes drowsiness let know.  Start Pepcid (famotidine) 20mg  twice a day.  . Avoid the following potential triggers: alcohol, tight clothing, NSAIDs, hot showers and getting overheated.

## 2021-12-08 NOTE — Progress Notes (Signed)
Follow Up Note  RE: Marie Jimenez MRN: [id number] DOB: 1975-09-12 Date of Office Visit: 12/08/2021  Referring provider: Katherina Mires, MD Primary care provider: Katherina Mires, MD  Chief Complaint: Follow-up and Urticaria  History of Present Illness: I had the pleasure of seeing Marie Jimenez for a follow up visit at the [facility] on 12/08/2021. She is a 46 y.o. 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 Her previous allergy office visit was on 11/01/2018 with Dr. Verlin Fester. Today is a regular follow up visit.  Chronic idiopathic urticaria Currently on Xolair 300mg  every 3 weeks. Initially she was on every 4 weeks but had breakthrough hives on the last week.  The last injection she was at 4 weeks and noticed that she was itching and had to use hydroxyzine.  Currently not taking any daily antihistamines.   No reaction on Xolair.  Mild intermittent asthma Used albuterol twice this year.  Denies any SOB, coughing, wheezing, chest tightness, nocturnal awakenings, ER/urgent care visits or prednisone use since the last visit.  Food allergy Currently avoiding all seafood. No reactions since the last visit.  Assessment and Plan: Marie Jimenez is a 46 y.o. female with: Chronic idiopathic urticaria Doing well with Xolair 300mg  every 3 weeks. Had itching last time when she was 1 week late. Not taking daily antihistamines. Continue Xolair 300mg  every 3-4 weeks.  During flares:  Start zyrtec (cetirizine) 10mg  twice a day. If symptoms are not controlled or causes drowsiness let us know. Start Pepcid (famotidine) 20mg  twice a day.  Avoid the following potential triggers: alcohol, tight clothing, NSAIDs, hot showers and getting overheated.  Food allergy Past history - 2020 skin testing was positive to shellfish and mollusks. Interim history - no reactions. Continue strict avoidance of seafood. For mild symptoms you can take over the  counter antihistamines such as Benadryl and monitor symptoms closely. If symptoms worsen or if you have severe symptoms including breathing issues, throat closure, significant swelling, whole body hives, severe diarrhea and vomiting, lightheadedness then inject epinephrine and seek immediate medical care afterwards.  Mild intermittent asthma without complication Rare albuterol use. Had spirometry in 2022 which was normal.  May use albuterol rescue inhaler 2 puffs every 4 to 6 hours as needed for shortness of breath, chest tightness, coughing, and wheezing. Monitor frequency of use.   Return in about 6 months (around 06/10/2022).  Meds ordered this encounter  Medications   EPINEPHrine 0.3 mg/0.3 mL IJ SOAJ injection    Sig: Inject 0.3 mg into the muscle as needed for anaphylaxis.    Dispense:  1 each    Refill:  2    May dispense generic/Mylan/Teva brand.   Lab Orders  No laboratory test(s) ordered today    Diagnostics: None.    Medication List:  Current Outpatient Medications  Medication Sig Dispense Refill   EPINEPHrine 0.3 mg/0.3 mL IJ SOAJ injection Inject 0.3 mg into the muscle as needed for anaphylaxis. 1 each 2   famotidine (PEPCID) 20 MG tablet Take 1 tablet (20 mg total) by mouth 2 (two) times daily. 30 tablet 0   hydrOXYzine (ATARAX/VISTARIL) 25 MG tablet      levocetirizine (XYZAL) 5 MG tablet      levothyroxine (SYNTHROID, LEVOTHROID) 112 MCG tablet Take 112 mcg by mouth daily.     meclizine (ANTIVERT) 50 MG tablet Take 1/2 to 1 tablet 3 times daily for dizziness as needed, beware sedation 30 tablet 0  omalizumab (XOLAIR) 150 MG injection Inject 300 mg into the skin every 14 (fourteen) days. 12 each 3   triamcinolone cream (KENALOG) 0.5 % APP EXT AA BID PRF RASH     albuterol (PROVENTIL HFA;VENTOLIN HFA) 108 (90 Base) MCG/ACT inhaler Inhale into the lungs. (Patient not taking: Reported on 12/08/2021)     Current Facility-Administered Medications  Medication Dose Route  Frequency Provider Last Rate Last Admin   omalizumab Marie Jimenez Right) injection 300 mg  300 mg Subcutaneous Q21 days Valentina Shaggy, MD   300 mg at 12/04/21 [id number]   Allergies: Allergies  Allergen Reactions   Amoxicillin Swelling   Shellfish Allergy Swelling   I reviewed her past medical history, social history, family history, and environmental history and no significant changes have been reported from her previous visit.  Review of Systems  Constitutional:  Negative for appetite change, chills, fever and unexpected weight change.  HENT:  Negative for congestion and rhinorrhea.   Eyes:  Negative for itching.  Respiratory:  Negative for cough, chest tightness, shortness of breath and wheezing.   Gastrointestinal:  Negative for abdominal pain.  Skin:  Negative for rash.       pruritus  Neurological:  Negative for headaches.   Objective: BP 128/78   Pulse 74   Temp 98.1 F (36.7 C)   Resp 18   Wt 282 lb 8 oz (128.1 kg)   SpO2 98%   BMI 39.40 kg/m  Body mass index is 39.4 kg/m. Physical Exam Vitals and nursing note reviewed.  Constitutional:      Appearance: Normal appearance. She is well-developed.  HENT:     Head: Normocephalic and atraumatic.     Right Ear: Tympanic membrane and external ear normal.     Left Ear: Tympanic membrane and external ear normal.     Nose: Nose normal.     Mouth/Throat:     Mouth: Mucous membranes are moist.     Pharynx: Oropharynx is clear.  Eyes:     Conjunctiva/sclera: Conjunctivae normal.  Cardiovascular:     Rate and Rhythm: Normal rate and regular rhythm.     Heart sounds: Normal heart sounds. No murmur heard. Pulmonary:     Effort: Pulmonary effort is normal.     Breath sounds: Normal breath sounds. No wheezing, rhonchi or rales.  Musculoskeletal:     Cervical back: Neck supple.  Skin:    General: Skin is warm.     Findings: No rash.  Neurological:     Mental Status: She is alert and oriented to person, place, and time.   Psychiatric:        Behavior: Behavior normal.   Previous notes and tests were reviewed. The plan was reviewed with the patient/family, and all questions/concerned were addressed.  It was my pleasure to see [name] today and participate in her care. Please feel free to contact me with any questions or concerns.  Sincerely,  Rexene Alberts, DO Allergy & Immunology  [facility] office: [place] office: [phone]

## 2021-12-08 NOTE — Assessment & Plan Note (Signed)
Rare albuterol use. Had spirometry in 2022 which was normal.  . May use albuterol rescue inhaler 2 puffs every 4 to 6 hours as needed for shortness of breath, chest tightness, coughing, and wheezing. Monitor frequency of use.

## 2021-12-25 ENCOUNTER — Ambulatory Visit (INDEPENDENT_AMBULATORY_CARE_PROVIDER_SITE_OTHER)

## 2021-12-25 DIAGNOSIS — L501 Idiopathic urticaria: Secondary | ICD-10-CM

## 2022-01-15 ENCOUNTER — Ambulatory Visit (INDEPENDENT_AMBULATORY_CARE_PROVIDER_SITE_OTHER)

## 2022-01-15 DIAGNOSIS — L501 Idiopathic urticaria: Secondary | ICD-10-CM | POA: Diagnosis not present

## 2022-02-05 ENCOUNTER — Ambulatory Visit (INDEPENDENT_AMBULATORY_CARE_PROVIDER_SITE_OTHER): Admitting: *Deleted

## 2022-02-05 DIAGNOSIS — L501 Idiopathic urticaria: Secondary | ICD-10-CM | POA: Diagnosis not present

## 2022-02-21 DIAGNOSIS — E039 Hypothyroidism, unspecified: Secondary | ICD-10-CM | POA: Insufficient documentation

## 2022-02-21 DIAGNOSIS — R079 Chest pain, unspecified: Secondary | ICD-10-CM | POA: Insufficient documentation

## 2022-02-21 DIAGNOSIS — J45909 Unspecified asthma, uncomplicated: Secondary | ICD-10-CM | POA: Diagnosis not present

## 2022-02-21 DIAGNOSIS — Z79899 Other long term (current) drug therapy: Secondary | ICD-10-CM | POA: Diagnosis not present

## 2022-02-22 ENCOUNTER — Emergency Department

## 2022-02-22 ENCOUNTER — Other Ambulatory Visit: Payer: Self-pay

## 2022-02-22 ENCOUNTER — Encounter: Payer: Self-pay | Admitting: Emergency Medicine

## 2022-02-22 ENCOUNTER — Emergency Department
Admission: EM | Admit: 2022-02-22 | Discharge: 2022-02-22 | Disposition: A | Discharge status: Home / Self Care | Attending: Emergency Medicine | Admitting: Emergency Medicine

## 2022-02-22 DIAGNOSIS — R079 Chest pain, unspecified: Secondary | ICD-10-CM

## 2022-02-22 LAB — CBC
HCT: 37 % (ref 36.0–46.0)
Hemoglobin: 11.9 g/dL — ABNORMAL LOW (ref 12.0–15.0)
MCH: 29.8 pg (ref 26.0–34.0)
MCHC: 32.2 g/dL (ref 30.0–36.0)
MCV: 92.5 fL (ref 80.0–100.0)
Platelets: 334 10*3/uL (ref 150–400)
RBC: 4 MIL/uL (ref 3.87–5.11)
RDW: 12.4 % (ref 11.5–15.5)
WBC: 6.8 10*3/uL (ref 4.0–10.5)
nRBC: 0 % (ref 0.0–0.2)

## 2022-02-22 LAB — BASIC METABOLIC PANEL
Anion gap: 6 (ref 5–15)
BUN: 15 mg/dL (ref 6–20)
CO2: 25 mmol/L (ref 22–32)
Calcium: 8.4 mg/dL — ABNORMAL LOW (ref 8.9–10.3)
Chloride: 106 mmol/L (ref 98–111)
Creatinine, Ser: 0.87 mg/dL (ref 0.44–1.00)
GFR, Estimated: >60 mL/min (ref >60)
Glucose, Bld: 96 mg/dL (ref 70–99)
Potassium: 3.7 mmol/L (ref 3.5–5.1)
Sodium: 137 mmol/L (ref 135–145)

## 2022-02-22 LAB — T4, FREE: Free T4: 1.04 ng/dL (ref 0.61–1.12)

## 2022-02-22 LAB — D-DIMER, QUANTITATIVE: D-Dimer, Quant: 0.43 ug/mL-FEU (ref 0.00–0.50)

## 2022-02-22 LAB — TSH: TSH: 3.213 u[IU]/mL (ref 0.350–4.500)

## 2022-02-22 LAB — TROPONIN I (HIGH SENSITIVITY)
Troponin I (High Sensitivity): 3 ng/L (ref <18)
Troponin I (High Sensitivity): 4 ng/L (ref <18)

## 2022-02-22 MED ORDER — KETOROLAC TROMETHAMINE 30 MG/ML IJ SOLN: 15.0000 mg | Freq: Once | INTRAMUSCULAR | Status: DC

## 2022-02-22 NOTE — ED Provider Notes (Signed)
Patient was signed out to me pending D-dimer.  D-dimer is negative.  Troponins x2 normal.  Patient is appropriate for discharge.   Georga Hacking, MD 02/22/22 [id number]

## 2022-02-22 NOTE — ED Triage Notes (Signed)
Pt arrived via POV with c/o chest pain that started about 90 mins prior to arrival, pt states she was sitting down watching TV when she felt her heart pounding in her chest and states she could see her heart pounding.  Pt states she does see a cardiologist - Dr. Boneta Lucks with [facility].  Pt reports some shortness of breath with the pain but also states she has hx of asthma.

## 2022-02-22 NOTE — Discharge Instructions (Signed)
Return to the ER for worsening symptoms, persistent vomiting, difficulty breathing or other concerns. °

## 2022-02-22 NOTE — ED Provider Notes (Signed)
[facility] Provider Note    Event Date/Time   First MD Initiated Contact with Patient 02/22/22 [phone]     (approximate)   History   Chest Pain   HPI  Marie Jimenez is a 46 y.o. female who presents to the ED for with a chief complaint of chest pain.  Patient reports tightness underneath her left ribs which began approximately 90 minutes prior to arrival.  She was watching TV at the time.  Her main concern is that her heart began pounding in her chest and she felt palpitations.  History of asthma.  Sees cardiology due to family history of CAD; denies personal history of CAD.  Denies recent illness.  Denies fever, cough, shortness of breath, abdominal pain, nausea, vomiting or dizziness.     Past Medical History   Past Medical History:  Diagnosis Date   Asthma    Hypothyroid      Active Problem List   Patient Active Problem List   Diagnosis Date Noted   Chronic idiopathic urticaria 08/02/2018   Food allergy 08/02/2018   Mild intermittent asthma without complication 08/02/2018     Past Surgical History   Past Surgical History:  Procedure Laterality Date   APPENDECTOMY     THYROIDECTOMY     TUBAL LIGATION       Home Medications   Prior to Admission medications   Medication Sig Start Date End Date Taking? Authorizing Provider  albuterol (PROVENTIL HFA;VENTOLIN HFA) 108 (90 Base) MCG/ACT inhaler Inhale into the lungs. Patient not taking: Reported on 12/08/2021 10/17/18   [provider]  EPINEPHrine 0.3 mg/0.3 mL IJ SOAJ injection Inject 0.3 mg into the muscle as needed for anaphylaxis. 12/08/21   Ellamae Sia, DO  famotidine (PEPCID) 20 MG tablet Take 1 tablet (20 mg total) by mouth 2 (two) times daily. 07/12/19   Harlene Salts A, PA-C  hydrOXYzine (ATARAX/VISTARIL) 25 MG tablet  10/20/18   [provider]  levocetirizine (XYZAL) 5 MG tablet  10/21/18   [provider]  levothyroxine (SYNTHROID, LEVOTHROID) 112 MCG  tablet Take 112 mcg by mouth daily. 04/19/18   [provider]  meclizine (ANTIVERT) 50 MG tablet Take 1/2 to 1 tablet 3 times daily for dizziness as needed, beware sedation 12/18/20   Sherrie Mustache Roselyn Bering, PA-C  omalizumab [name]) 150 MG injection Inject 300 mg into the skin every 14 (fourteen) days. 05/13/21   Alfonse Spruce, MD  triamcinolone cream (KENALOG) 0.5 % APP EXT AA BID PRF RASH 10/22/18   [provider]     Allergies  Amoxicillin and Shellfish allergy   Family History   Family History  Problem Relation Age of Onset   Allergic rhinitis Neg Hx    Asthma Neg Hx      Physical Exam  Triage Vital Signs: ED Triage Vitals  Enc Vitals Group     BP 02/22/22 0017 131/81     Pulse Rate 02/22/22 0017 63     Resp 02/22/22 0017 18     Temp 02/22/22 0017 98.7 F (37.1 C)     Temp Source 02/22/22 0017 Oral     SpO2 02/22/22 0017 98 %     Weight 02/22/22 0014 260 lb (117.9 kg)     Height 02/22/22 0014 5\' 10"  (1.778 m)     Head Circumference --      Peak Flow --      Pain Score 02/22/22 0014 4     Pain Loc --  Pain Edu? --      Excl. in GC? --     Updated Vital Signs: BP 126/80   Pulse 64   Temp 98.7 F (37.1 C) (Oral)   Resp 18   Ht 5\' 10"  (1.778 m)   Wt 117.9 kg   LMP 02/09/2022 (Exact Date)   SpO2 100%   BMI 37.31 kg/m    General: Awake, no distress.  CV:  RRR.  Good peripheral perfusion.  Resp:  Normal effort.  CTA B. Abd:  Nontender no distention.  Other:  No truncal vesicles.  Bilateral calves are nontender and nonswollen.   ED Results / Procedures / Treatments  Labs (all labs ordered are listed, but only abnormal results are displayed) Labs Reviewed  BASIC METABOLIC PANEL - Abnormal; Notable for the following components:      Result Value   Calcium 8.4 (*)    All other components within normal limits  CBC - Abnormal; Notable for the following components:   Hemoglobin 11.9 (*)    All other components within normal limits   TSH  T4, FREE  D-DIMER, QUANTITATIVE  POC URINE PREG, ED  TROPONIN I (HIGH SENSITIVITY)  TROPONIN I (HIGH SENSITIVITY)     EKG  ED ECG REPORT I, Teyona Nichelson J, the attending physician, personally viewed and interpreted this ECG.   Date: 02/22/2022  EKG Time: 0010  Rate: 63  Rhythm: normal sinus rhythm  Axis: Normal  Intervals:none  ST&T Change: Nonspecific    RADIOLOGY I have independently visualized and interpreted patient's chest x-ray as well as noted the radiology interpretation:  Chest X-ray: No acute cardiopulmonary process  Official radiology report(s): DG Chest 2 View  Result Date: 02/22/2022 CLINICAL DATA:  Chest pain. EXAM: CHEST - 2 VIEW COMPARISON:  12/18/2020, CT 09/03/2020 FINDINGS: The cardiomediastinal contours are normal. The lungs are clear. Pulmonary vasculature is normal. No consolidation, pleural effusion, or pneumothorax. No acute osseous abnormalities are seen. IMPRESSION: Negative radiographs of the chest. Electronically Signed   By: 09/05/2020 M.D.   On: 02/22/2022 00:48     PROCEDURES:  Critical Care performed: No  .1-3 Lead EKG Interpretation  Performed by: 04/24/2022, MD Authorized by: Irean Hong, MD     Interpretation: normal     ECG rate:  65   ECG rate assessment: normal     Rhythm: sinus rhythm     Ectopy: none     Conduction: normal   Comments:     Patient placed on cardiac monitor to evaluate for arrhythmias    MEDICATIONS ORDERED IN ED: Medications  ketorolac (TORADOL) 30 MG/ML injection 15 mg (has no administration in time range)     IMPRESSION / MDM / ASSESSMENT AND PLAN / ED COURSE  I reviewed the triage vital signs and the nursing notes.                             46 year old female presenting with chest pain. Differential diagnosis includes, but is not limited to, ACS, aortic dissection, pulmonary embolism, cardiac tamponade, pneumothorax, pneumonia, pericarditis, myocarditis, GI-related causes including  esophagitis/gastritis, and musculoskeletal chest wall pain.   I have personally reviewed patient's records and note an allergist visit on 12/08/2021 for chronic idiopathic urticaria.  Patient's presentation is most consistent with acute presentation with potential threat to life or bodily function.  The patient is on the cardiac monitor to evaluate for evidence of arrhythmia and/or significant heart rate  changes.  Return results demonstrate normal WBC 6.8, normal electrolytes, 2 sets of negative troponins and negative x-ray.  Will check thyroid panel and D-dimer.  Patient notes she has had previous normal as well as elevated D-dimer with negative Doppler ultrasounds of her legs.  She denies calf pain or tenderness.  Will administer IV ketorolac for pain and reassess.  Clinical Course as of 02/22/22 2671  Wynelle Link Feb 22, 2022  2458 Care transferred to Dr. Sidney Ace at change of shift pending Ddimer.  If unremarkable, anticipate discharge home with cardiology follow-up. [JS]    Clinical Course User Index [JS] Irean Hong, MD     FINAL CLINICAL IMPRESSION(S) / ED DIAGNOSES   Final diagnoses:  Nonspecific chest pain     Rx / DC Orders   ED Discharge Orders     None        Note:  This document was prepared using Dragon voice recognition software and may include unintentional dictation errors.   Irean Hong, MD 02/22/22 [phone]

## 2022-02-26 ENCOUNTER — Ambulatory Visit (INDEPENDENT_AMBULATORY_CARE_PROVIDER_SITE_OTHER)

## 2022-02-26 DIAGNOSIS — L501 Idiopathic urticaria: Secondary | ICD-10-CM | POA: Diagnosis not present

## 2022-03-12 IMAGING — CR DG CHEST 2V
2 series · 2 of 2 positions shown · non-contrast
Comparison: Chest x-ray 09/04/2020.

CLINICAL DATA: Chest pain.  Shortness of breath.  Dizziness.

EXAM:
CHEST - 2 VIEW

[chest pa]
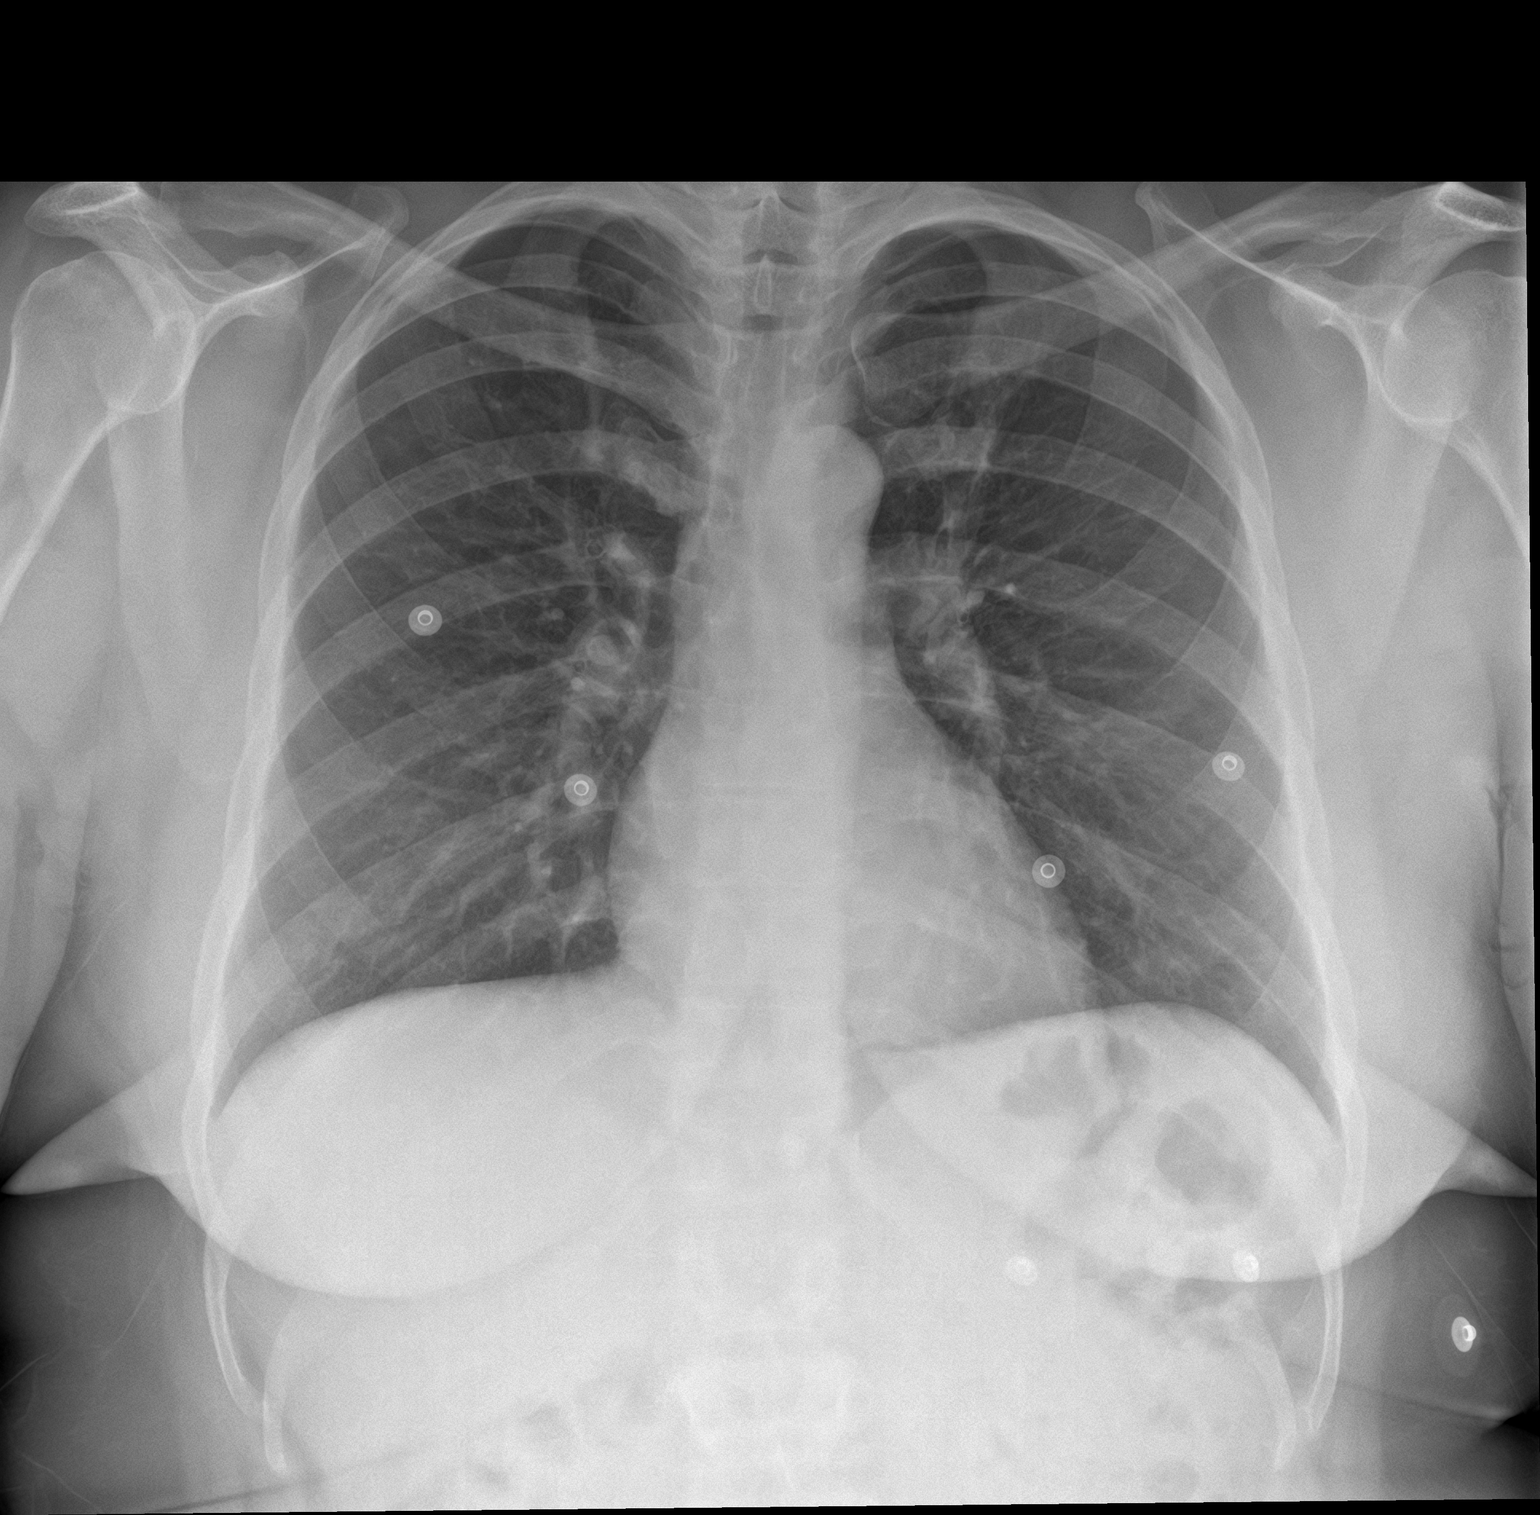

[chest lat]
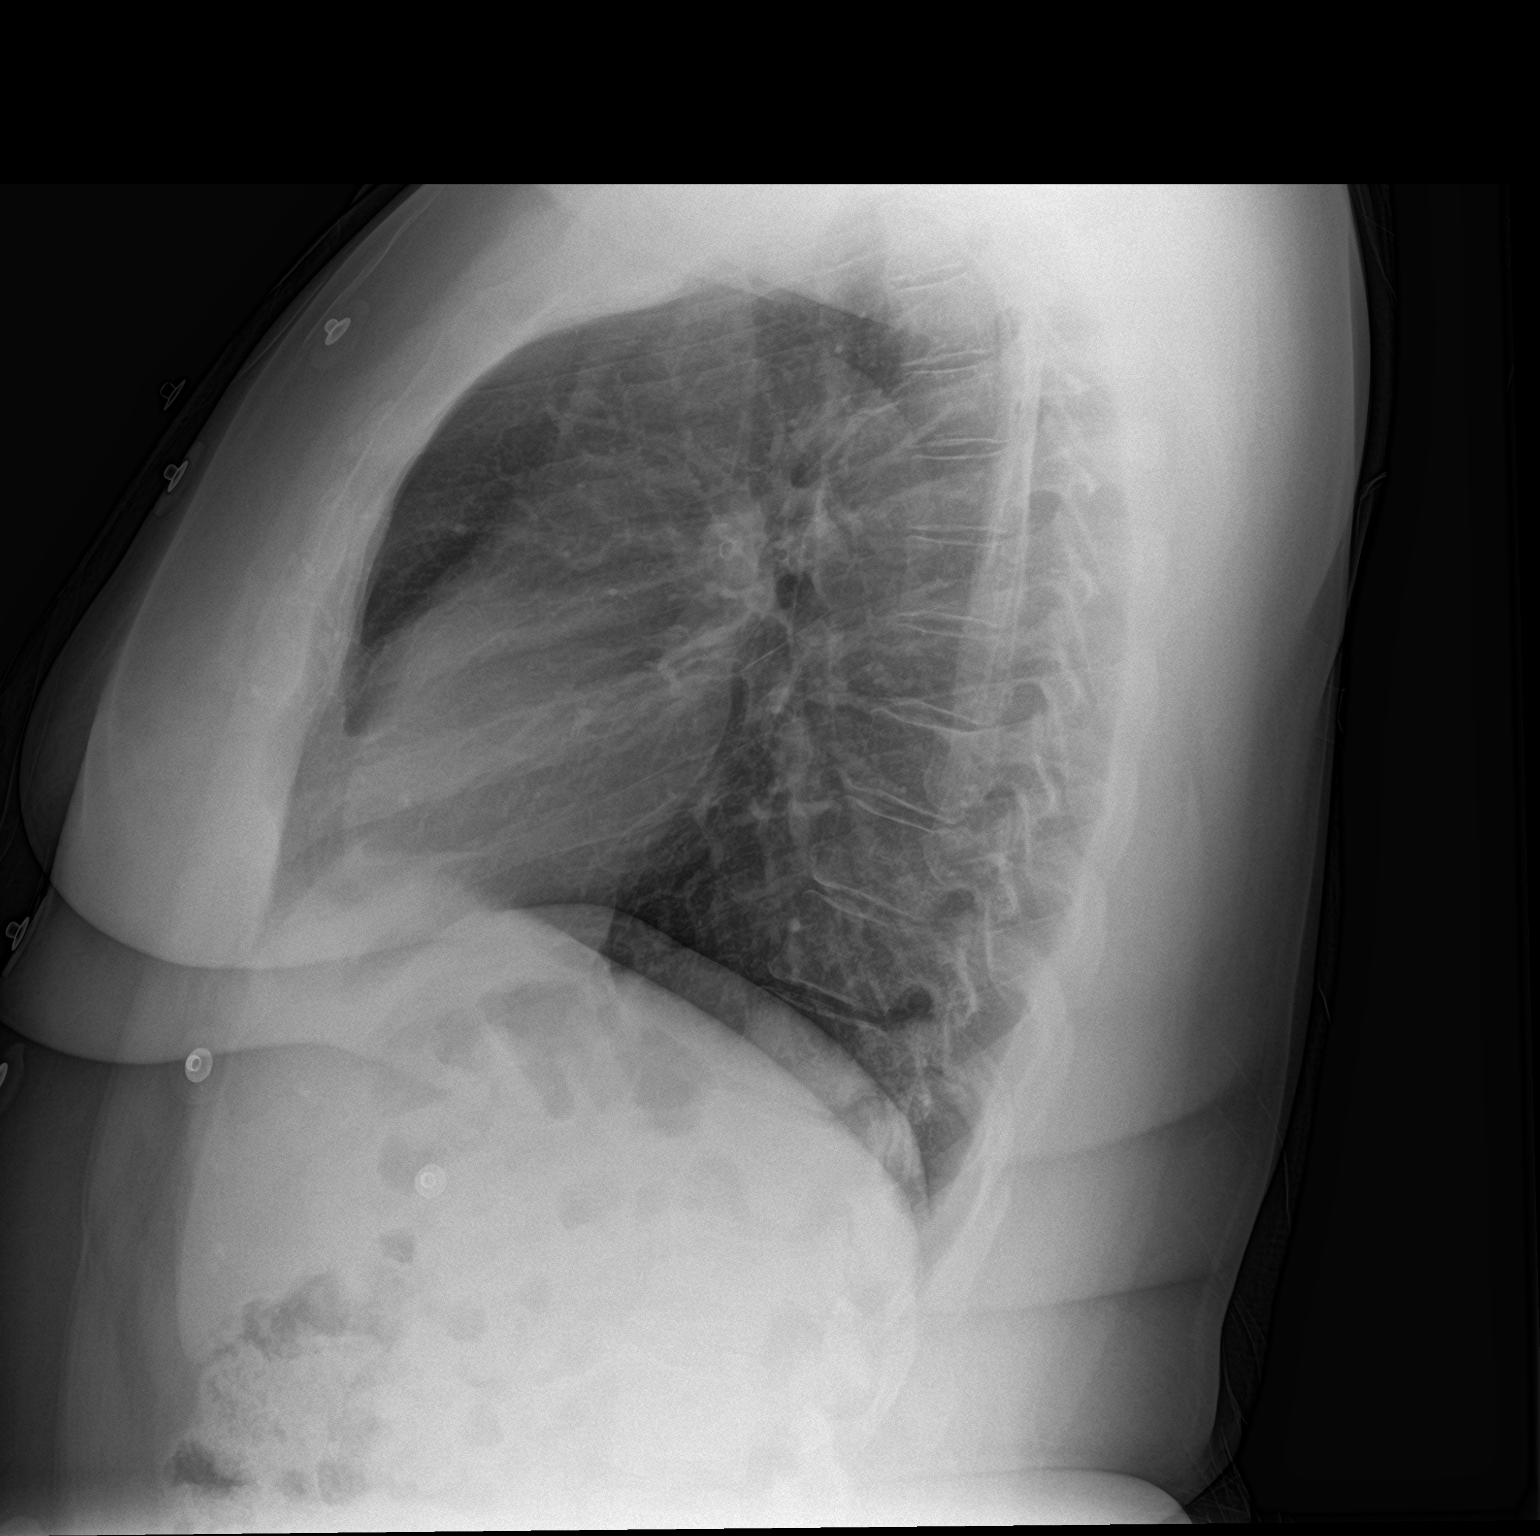

[2 of 2 positions shown; findings below may reference images not displayed]

FINDINGS: Mediastinum and hilar structures normal. Lungs are clear. No pleural
effusion or pneumothorax. Heart size normal. No acute bony
abnormality.
IMPRESSION: No acute cardiopulmonary disease.

## 2022-03-12 IMAGING — CT CT HEAD W/O CM
3 series · 16 of 45 positions shown, 19 images · non-contrast
Comparison: None.

CLINICAL DATA: 44-year-old female with dizziness.

EXAM:
CT HEAD WITHOUT CONTRAST
TECHNIQUE: Contiguous axial images were obtained from the base of the skull
through the vertex without intravenous contrast.

[Series 2: head wo · axial · 0.45mm/px · z∈[-167,-52]mm · 10 of 28 slices shown, 13 images]
[im 3/28  brain]
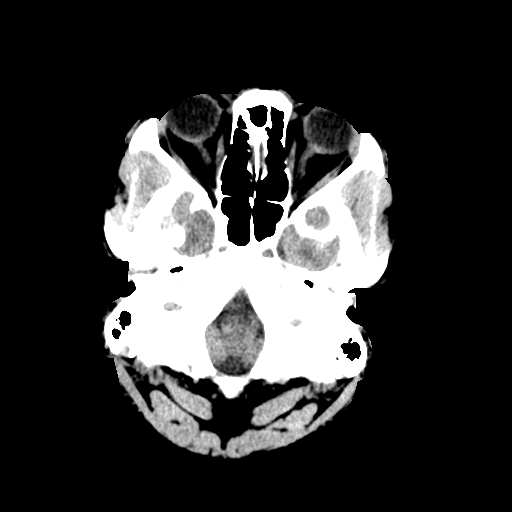
[im 3/28  bone]
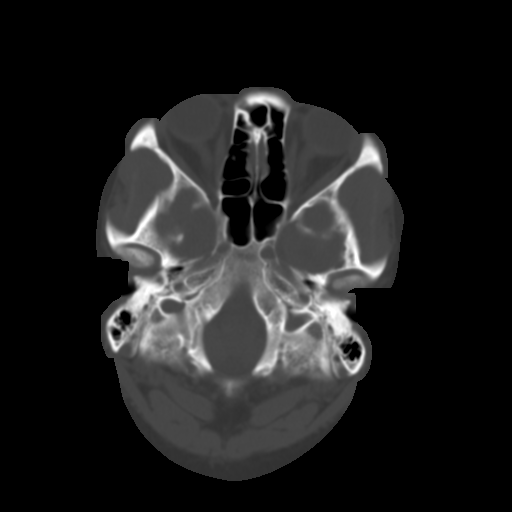
[im 5/28  brain]
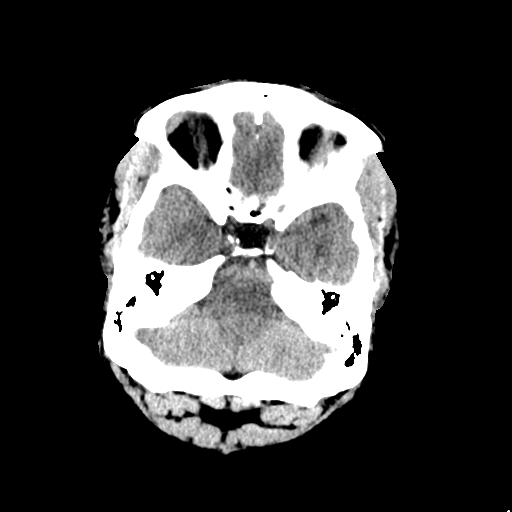
[im 8/28  brain]
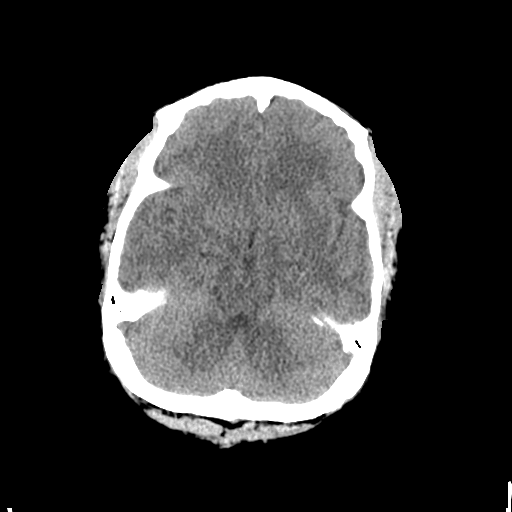
[im 11/28  brain]
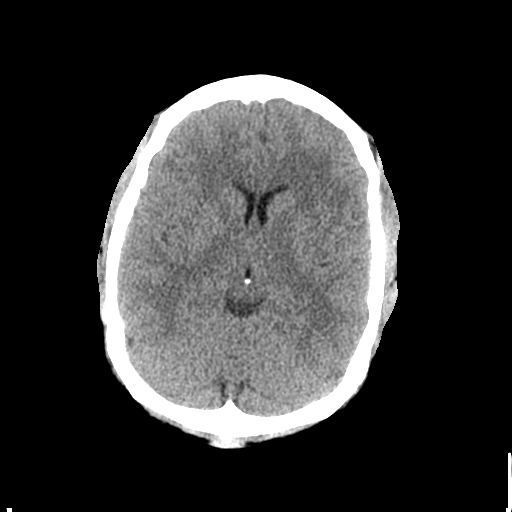
[im 13/28  brain]
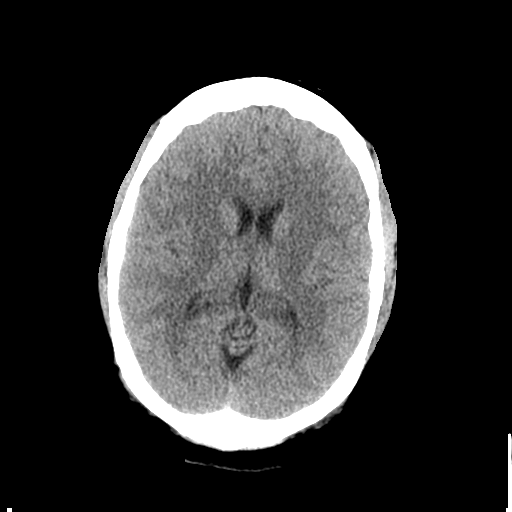
[im 13/28  bone]
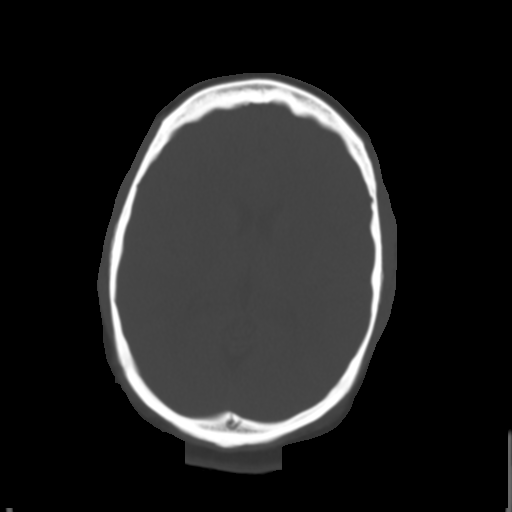
[im 16/28  brain]
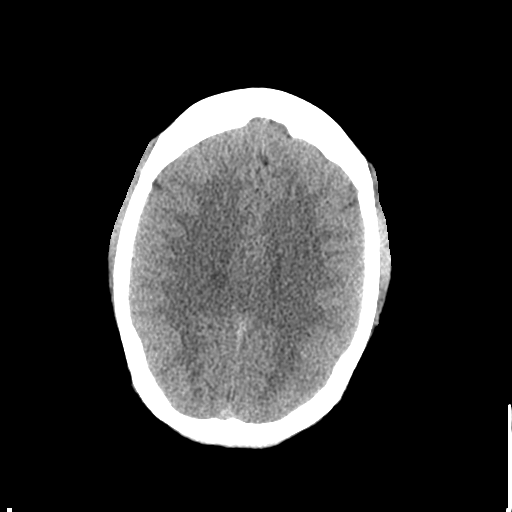
[im 18/28  brain]
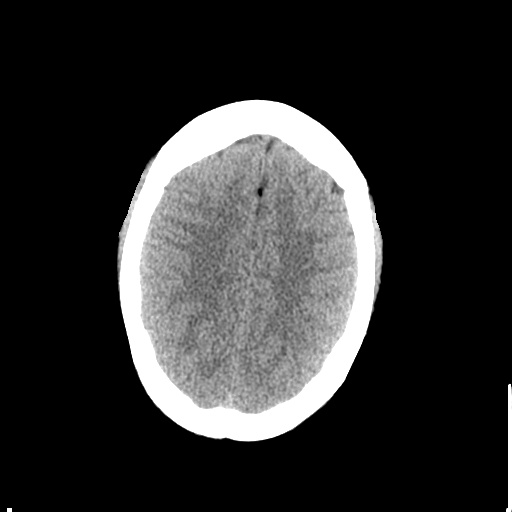
[im 21/28  brain]
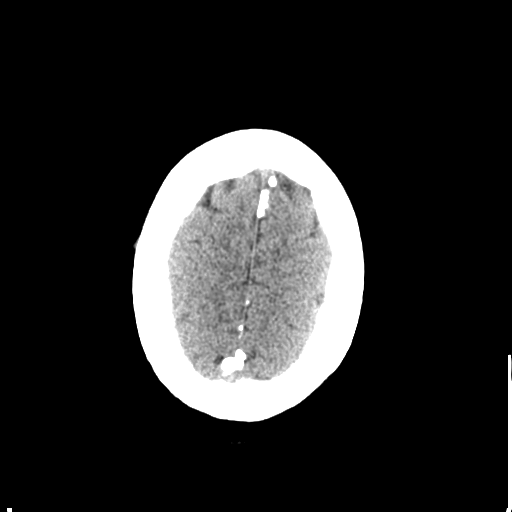
[im 24/28  brain]
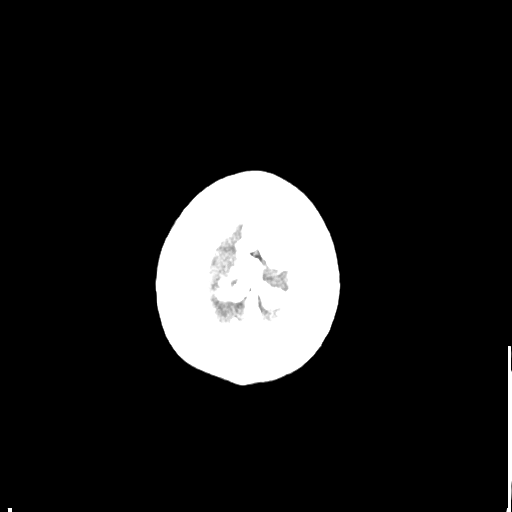
[im 24/28  bone]
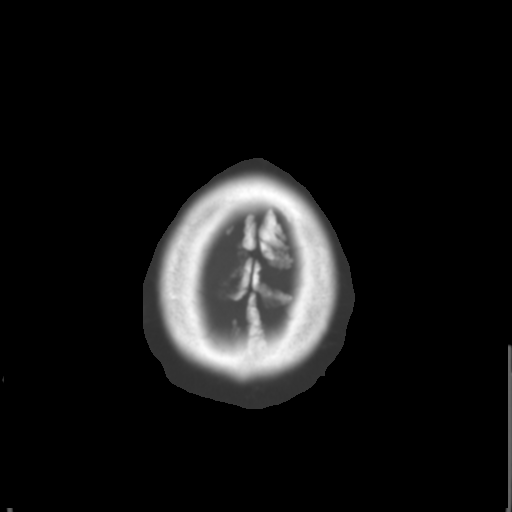
[im 26/28  brain]
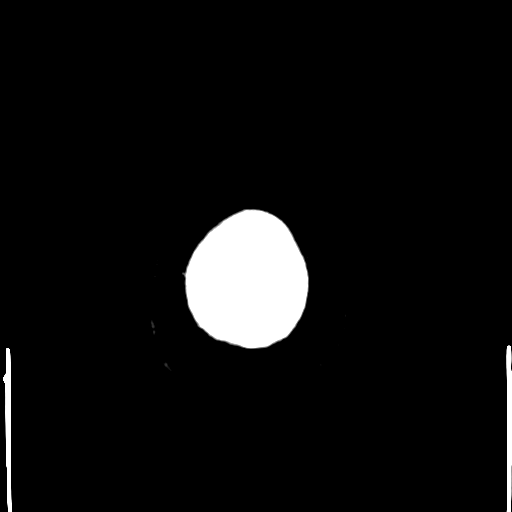

[Series 4: coronal soft tissue · coronal · 0.31mm/px · 3 of 60 slices shown]
[im 20/60  brain]
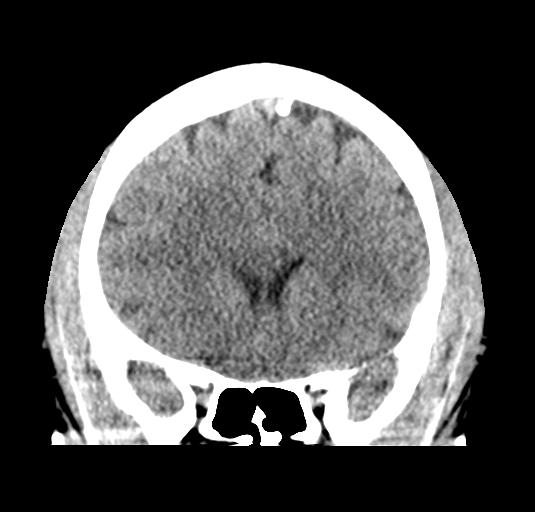
[im 27/60  brain]
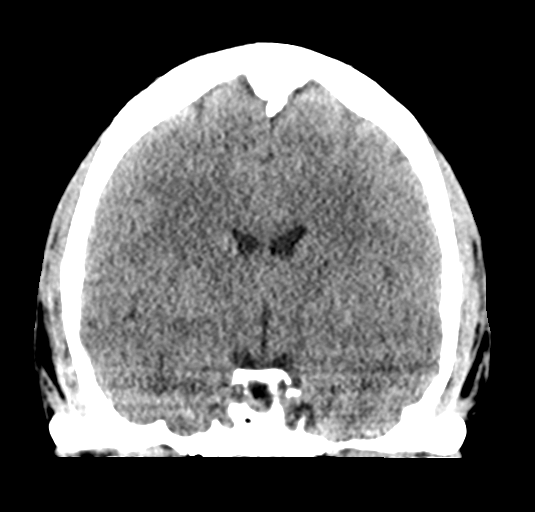
[im 33/60  brain]
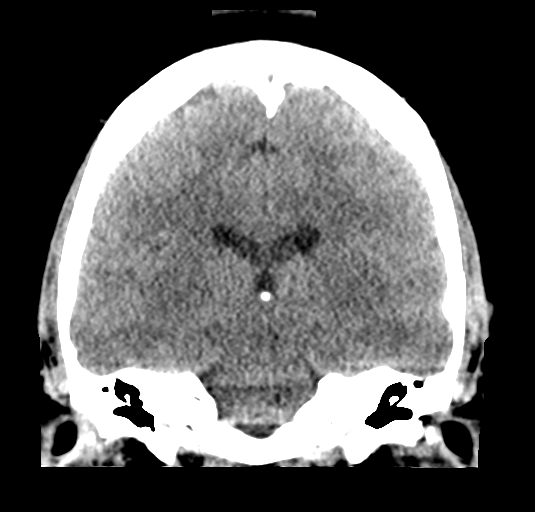

[Series 5: sagittal soft tissue · sagittal · 0.31mm/px · 3 of 56 slices shown]
[im 19/56  brain]
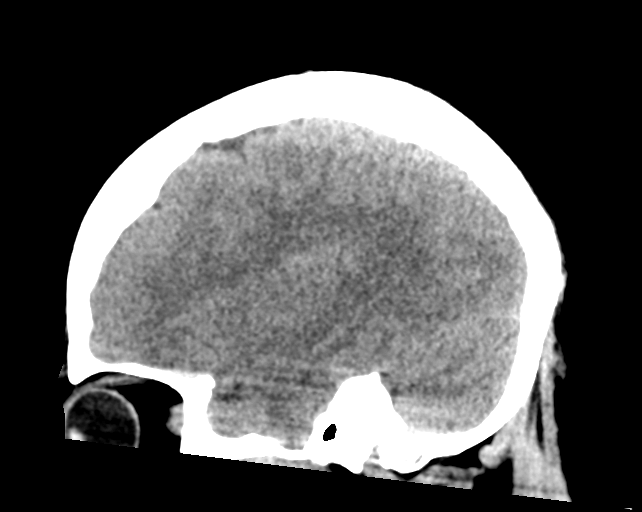
[im 28/56  brain]
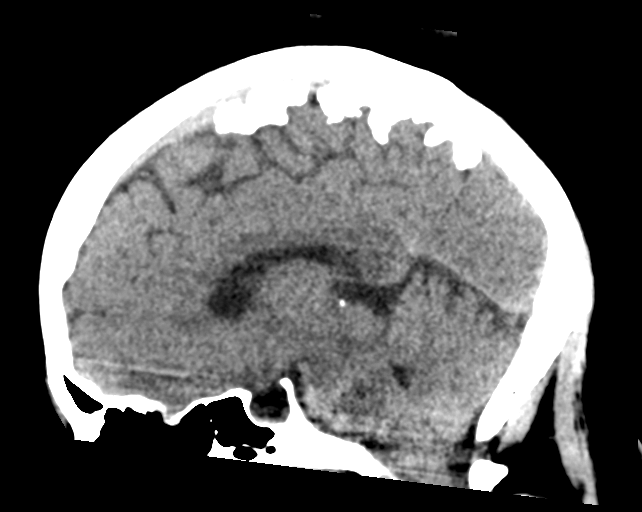
[im 37/56  brain]
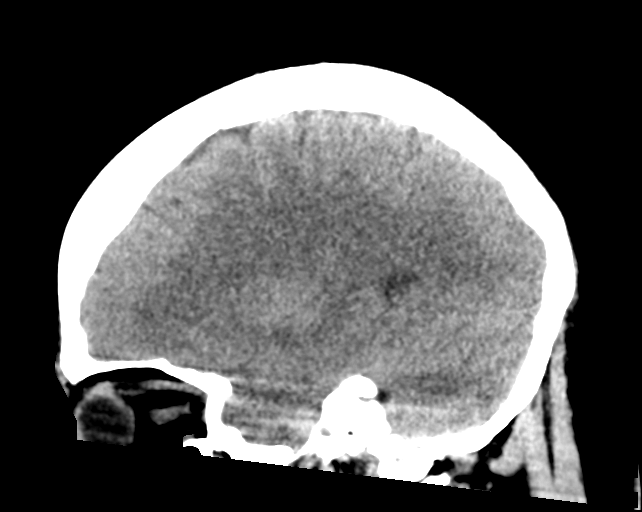

[16 of 45 positions shown; findings below may reference images not displayed]

FINDINGS: Brain: No evidence of acute infarction, hemorrhage, hydrocephalus,
extra-axial collection or mass lesion/mass effect.

Vascular: No hyperdense vessel or unexpected calcification.

Skull: Normal. Negative for fracture or focal lesion.

Sinuses/Orbits: No acute finding.

Other: None
IMPRESSION: Normal noncontrast CT of the brain.

## 2022-03-19 ENCOUNTER — Ambulatory Visit (INDEPENDENT_AMBULATORY_CARE_PROVIDER_SITE_OTHER)

## 2022-03-19 ENCOUNTER — Other Ambulatory Visit: Payer: Self-pay | Admitting: Allergy & Immunology

## 2022-03-19 DIAGNOSIS — L501 Idiopathic urticaria: Secondary | ICD-10-CM

## 2022-04-09 ENCOUNTER — Ambulatory Visit (INDEPENDENT_AMBULATORY_CARE_PROVIDER_SITE_OTHER)

## 2022-04-09 DIAGNOSIS — L501 Idiopathic urticaria: Secondary | ICD-10-CM | POA: Diagnosis not present

## 2022-05-05 ENCOUNTER — Ambulatory Visit (INDEPENDENT_AMBULATORY_CARE_PROVIDER_SITE_OTHER)

## 2022-05-05 DIAGNOSIS — L501 Idiopathic urticaria: Secondary | ICD-10-CM

## 2022-05-26 ENCOUNTER — Ambulatory Visit (INDEPENDENT_AMBULATORY_CARE_PROVIDER_SITE_OTHER)

## 2022-05-26 DIAGNOSIS — L501 Idiopathic urticaria: Secondary | ICD-10-CM | POA: Diagnosis not present

## 2022-06-16 ENCOUNTER — Ambulatory Visit (INDEPENDENT_AMBULATORY_CARE_PROVIDER_SITE_OTHER)

## 2022-06-16 DIAGNOSIS — L501 Idiopathic urticaria: Secondary | ICD-10-CM | POA: Diagnosis not present

## 2022-07-01 ENCOUNTER — Ambulatory Visit: Admitting: Allergy

## 2022-07-01 ENCOUNTER — Encounter: Payer: Self-pay | Admitting: Allergy

## 2022-07-01 ENCOUNTER — Other Ambulatory Visit: Payer: Self-pay

## 2022-07-01 VITALS — BP 110/80 | HR 80 | Temp 98.3°F | Resp 18

## 2022-07-01 DIAGNOSIS — T7800XD Anaphylactic reaction due to unspecified food, subsequent encounter: Secondary | ICD-10-CM

## 2022-07-01 DIAGNOSIS — L501 Idiopathic urticaria: Secondary | ICD-10-CM

## 2022-07-01 DIAGNOSIS — T7800XA Anaphylactic reaction due to unspecified food, initial encounter: Secondary | ICD-10-CM

## 2022-07-01 DIAGNOSIS — J4520 Mild intermittent asthma, uncomplicated: Secondary | ICD-10-CM

## 2022-07-01 MED ORDER — METHYLPREDNISOLONE ACETATE 80 MG/ML IJ SUSP
80.0000 mg | Freq: Once | INTRAMUSCULAR | Status: AC
  Administered 2022-07-01: 80 mg via INTRAMUSCULAR

## 2022-07-01 MED ORDER — TRIAMCINOLONE ACETONIDE 0.1 % EX OINT: 1.0000 | TOPICAL_OINTMENT | Freq: Two times a day (BID) | CUTANEOUS | 2 refills | Status: AC | PRN

## 2022-07-01 MED ORDER — HYDROXYZINE HCL 25 MG PO TABS: 25.0000 mg | ORAL_TABLET | Freq: Three times a day (TID) | ORAL | 1 refills | Status: DC | PRN

## 2022-07-01 MED ORDER — FEXOFENADINE HCL 180 MG PO TABS
180.0000 mg | ORAL_TABLET | Freq: Two times a day (BID) | ORAL | 2 refills | Status: AC
Start: 2022-07-01 — End: ?

## 2022-07-01 MED ORDER — FAMOTIDINE 20 MG PO TABS: 20.0000 mg | ORAL_TABLET | Freq: Two times a day (BID) | ORAL | 2 refills | Status: AC

## 2022-07-01 NOTE — Assessment & Plan Note (Signed)
Doing well with Xolair 300mg  every 3 weeks and had no flares with no antihistamines until 2 weeks ago. Started itching on her arms after her trip. Denies changes in diet, meds, personal care products or recent infections.  Depo 80mg  given today.  Continue Xolair 300mg  every 3 weeks.  If you are still breaking out we may need to move to every 2 weeks.  If you have a flare:  Start allegra (fexofenadine) 180mg  twice a day. If symptoms are not controlled or causes drowsiness let [place] know. Start pepcid (famotidine) 20mg  twice a day.  May use hydroxyzine 25mg  every 8 hours as needed for itching. Avoid the following potential triggers: alcohol, tight clothing, NSAIDs, hot showers and getting overheated. Use triamcinolone 0.1% ointment twice a day as needed for rash flares. Do not use on the face, neck, armpits or groin area. Do not use more than 3 weeks in a row.

## 2022-07-01 NOTE — Assessment & Plan Note (Signed)
Past history - 2020 skin testing was positive to shellfish and mollusks. Continue strict avoidance of seafood. For mild symptoms you can take over the counter antihistamines such as Benadryl and monitor symptoms closely. If symptoms worsen or if you have severe symptoms including breathing issues, throat closure, significant swelling, whole body hives, severe diarrhea and vomiting, lightheadedness then inject epinephrine and seek immediate medical care afterwards.

## 2022-07-01 NOTE — Progress Notes (Signed)
Follow Up Note  RE: Marie Jimenez MRN: [id number] DOB: [birth year] Date of Office Visit: 07/01/2022  Referring provider: Macy Mis, MD Primary care provider: Macy Mis, MD  Chief Complaint: Pruritus and Rash  History of Present Illness: I had the pleasure of seeing Marie Jimenez for a follow up visit at the [facility] on 07/01/2022. She is a 46 y.o. 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 Her previous allergy office visit was on 12/08/2021 with Dr. Selena Batten. Today is a regular follow up visit.  Chronic idiopathic urticaria Started to have itching and breaking out on her arms 2 weeks ago.  Currently on Xolair 300mg  every 3 weeks.  Taking hydroxyzine 25mg  TID and benadryl which helps but it maker her really drowsy.  Patient has not been taking zyrtec and famotidine for months now with no flares.  She went to during Thanksgiving and this started afterwards. She had some prednisone pills at home which she took as well and the symptoms resolved while taking the steroids.   Denies any changes in diet, medication, personal care products or recent infections.  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 Daughter got sandflies on her back and needed antibiotics for it.   Food allergy No reactions.  Mild intermittent asthma without complication No inhaler use.   Assessment and Plan: [name] is a 46 y.o. female with: Chronic idiopathic urticaria Doing well with Xolair 300mg  every 3 weeks and had no flares with no antihistamines until 2 weeks ago. Started itching on her arms after her [place] trip. Denies changes in diet, meds, personal care products or recent infections.  Depo 80mg  given today.  Continue Xolair 300mg  every 3 weeks.  If you are still breaking out we may need to move to every 2 weeks.  If you have a flare:  Start allegra (fexofenadine) 180mg  twice a day. If symptoms are not  controlled or causes drowsiness let 54 know. Start pepcid (famotidine) 20mg  twice a day.  May use hydroxyzine 25mg  every 8 hours as needed for itching. Avoid the following potential triggers: alcohol, tight clothing, NSAIDs, hot showers and getting overheated. Use triamcinolone 0.1% ointment twice a day as needed for rash flares. Do not use on the face, neck, armpits or groin area. Do not use more than 3 weeks in a row.   Mild intermittent asthma without complication Past history - 2022 had normal spirometry. May use albuterol rescue inhaler 2 puffs every 4 to 6 hours as needed for shortness of breath, chest tightness, coughing, and wheezing. Monitor frequency of use.   Food allergy Past history - 2020 skin testing was positive to shellfish and mollusks. Continue strict avoidance of seafood. For mild symptoms you can take over the counter antihistamines such as Benadryl and monitor symptoms closely. If symptoms worsen or if you have severe symptoms including breathing issues, throat closure, significant swelling, whole body hives, severe diarrhea and vomiting, lightheadedness then inject epinephrine and seek immediate medical care afterwards.  Return in about 4 months (around 10/31/2022).  Meds ordered this encounter  Medications   triamcinolone ointment (KENALOG) 0.1 %    Sig: Apply 1 Application topically 2 (two) times daily as needed (rash flare). Do not use on the face, neck, armpits or groin area. Do not use more than 3 weeks in a row.    Dispense:  30 g    Refill:  2   famotidine (PEPCID)  20 MG tablet    Sig: Take 1 tablet (20 mg total) by mouth 2 (two) times daily.    Dispense:  60 tablet    Refill:  2   fexofenadine (ALLEGRA) 180 MG tablet    Sig: Take 1 tablet (180 mg total) by mouth in the morning and at bedtime.    Dispense:  60 tablet    Refill:  2   hydrOXYzine (ATARAX) 25 MG tablet    Sig: Take 1 tablet (25 mg total) by mouth 3 (three) times daily as needed for itching.     Dispense:  90 tablet    Refill:  1   methylPREDNISolone acetate (DEPO-MEDROL) injection 80 mg   Lab Orders  No laboratory test(s) ordered today    Diagnostics: None.   Medication List:  Current Outpatient Medications  Medication Sig Dispense Refill   albuterol (PROVENTIL HFA;VENTOLIN HFA) 108 (90 Base) MCG/ACT inhaler Inhale into the lungs.     EPINEPHrine 0.3 mg/0.3 mL IJ SOAJ injection Inject 0.3 mg into the muscle as needed for anaphylaxis. 1 each 2   famotidine (PEPCID) 20 MG tablet Take 1 tablet (20 mg total) by mouth 2 (two) times daily. 60 tablet 2   fexofenadine (ALLEGRA) 180 MG tablet Take 1 tablet (180 mg total) by mouth in the morning and at bedtime. 60 tablet 2   hydrOXYzine (ATARAX) 25 MG tablet Take 1 tablet (25 mg total) by mouth 3 (three) times daily as needed for itching. 90 tablet 1   levothyroxine (SYNTHROID, LEVOTHROID) 112 MCG tablet Take 112 mcg by mouth daily.     meclizine (ANTIVERT) 50 MG tablet Take 1/2 to 1 tablet 3 times daily for dizziness as needed, beware sedation 30 tablet 0   triamcinolone ointment (KENALOG) 0.1 % Apply 1 Application topically 2 (two) times daily as needed (rash flare). Do not use on the face, neck, armpits or groin area. Do not use more than 3 weeks in a row. 30 g 2   XOLAIR 150 MG injection INJECT 300 MG UNDER THE SKIN EVERY 14 DAYS 12 each 3   Current Facility-Administered Medications  Medication Dose Route Frequency Provider Last Rate Last Admin   omalizumab [name]) injection 300 mg  300 mg Subcutaneous Q21 days Alfonse Spruce, MD   300 mg at 06/16/22 1740   Allergies: Allergies  Allergen Reactions   Amoxicillin Swelling   Shellfish Allergy Swelling   I reviewed her past medical history, social history, family history, and environmental history and no significant changes have been reported from her previous visit.  Review of Systems  Constitutional:  Negative for appetite change, chills, fever and unexpected weight  change.  HENT:  Negative for congestion and rhinorrhea.   Eyes:  Negative for itching.  Respiratory:  Negative for cough, chest tightness, shortness of breath and wheezing.   Gastrointestinal:  Negative for abdominal pain.  Skin:  Positive for rash.       pruritus  Neurological:  Negative for headaches.    Objective: BP 110/80 (BP Location: Left Arm, Patient Position: Sitting, Cuff Size: Normal)   Pulse 80   Temp 98.3 F (36.8 C) (Temporal)   Resp 18   SpO2 98%  There is no height or weight on file to calculate BMI. Physical Exam Vitals and nursing note reviewed.  Constitutional:      Appearance: Normal appearance. She is well-developed.  HENT:     Head: Normocephalic and atraumatic.     Right Ear: Tympanic membrane and  external ear normal.     Left Ear: Tympanic membrane and external ear normal.     Nose: Nose normal.     Mouth/Throat:     Mouth: Mucous membranes are moist.     Pharynx: Oropharynx is clear.  Eyes:     Conjunctiva/sclera: Conjunctivae normal.  Cardiovascular:     Rate and Rhythm: Normal rate and regular rhythm.     Heart sounds: Normal heart sounds. No murmur heard. Pulmonary:     Effort: Pulmonary effort is normal.     Breath sounds: Normal breath sounds. No wheezing, rhonchi or rales.  Musculoskeletal:     Cervical back: Neck supple.  Skin:    General: Skin is warm.     Findings: Rash present.     Comments: Few excoriation marks on left forearm.   Neurological:     Mental Status: She is alert and oriented to person, place, and time.  Psychiatric:        Behavior: Behavior normal.   Previous notes and tests were reviewed. The plan was reviewed with the patient/family, and all questions/concerned were addressed.  It was my pleasure to see Milda today and participate in her care. Please feel free to contact me with any questions or concerns.  Sincerely,  Wyline Mood, DO Allergy & Immunology  [facility] office: [phone] [facility] office: [phone]

## 2022-07-01 NOTE — Patient Instructions (Addendum)
Hives: Steroid injection given today.  Continue Xolair 300mg  every 3 weeks.  If you are still breaking out we may need to move to every 2 weeks.   If you have a flare:  Start allegra (fexofenadine) 180mg  twice a day. If symptoms are not controlled or causes drowsiness let know. Start pepcid (famotidine) 20mg  twice a day.  May use hydroxyzine 25mg  every 8 hours as needed for itching. Avoid the following potential triggers: alcohol, tight clothing, NSAIDs, hot showers and getting overheated. Use triamcinolone 0.1% ointment twice a day as needed for rash flares. Do not use on the face, neck, armpits or groin area. Do not use more than 3 weeks in a row.   Asthma May use albuterol rescue inhaler 2 puffs every 4 to 6 hours as needed for shortness of breath, chest tightness, coughing, and wheezing. Monitor frequency of use.   Food allergy Continue strict avoidance of seafood. For mild symptoms you can take over the counter antihistamines such as Benadryl and monitor symptoms closely. If symptoms worsen or if you have severe symptoms including breathing issues, throat closure, significant swelling, whole body hives, severe diarrhea and vomiting, lightheadedness then inject epinephrine and seek immediate medical care afterwards.  Follow up in 4 months or sooner if needed.   Skin care recommendations  Bath time: Always use lukewarm water. AVOID very hot or cold water. Keep bathing time to 5-10 minutes. Do NOT use bubble bath. Use a mild soap and use just enough to wash the dirty areas. Do NOT scrub skin vigorously.  After bathing, pat dry your skin with a towel. Do NOT rub or scrub the skin.  Moisturizers and prescriptions:  ALWAYS apply moisturizers immediately after bathing (within 3 minutes). This helps to lock-in moisture. Use the moisturizer several times a day over the whole body. Good summer moisturizers include: Aveeno, CeraVe, Cetaphil. Good winter moisturizers include:  Aquaphor, Vaseline, Cerave, Cetaphil, Eucerin, Vanicream. When using moisturizers along with medications, the moisturizer should be applied about one hour after applying the medication to prevent diluting effect of the medication or moisturize around where you applied the medications. When not using medications, the moisturizer can be continued twice daily as maintenance.  Laundry and clothing: Avoid laundry products with added color or perfumes. Use unscented hypo-allergenic laundry products such as Tide free, Cheer free & gentle, and All free and clear.  If the skin still seems dry or sensitive, you can try double-rinsing the clothes. Avoid tight or scratchy clothing such as wool. Do not use fabric softeners or dyer sheets.

## 2022-07-01 NOTE — Assessment & Plan Note (Signed)
Past history - 2022 had normal spirometry. May use albuterol rescue inhaler 2 puffs every 4 to 6 hours as needed for shortness of breath, chest tightness, coughing, and wheezing. Monitor frequency of use.

## 2022-07-07 ENCOUNTER — Ambulatory Visit (INDEPENDENT_AMBULATORY_CARE_PROVIDER_SITE_OTHER)

## 2022-07-07 DIAGNOSIS — L501 Idiopathic urticaria: Secondary | ICD-10-CM | POA: Diagnosis not present

## 2022-07-28 ENCOUNTER — Ambulatory Visit (INDEPENDENT_AMBULATORY_CARE_PROVIDER_SITE_OTHER)

## 2022-07-28 DIAGNOSIS — L501 Idiopathic urticaria: Secondary | ICD-10-CM | POA: Diagnosis not present

## 2022-08-11 ENCOUNTER — Ambulatory Visit (INDEPENDENT_AMBULATORY_CARE_PROVIDER_SITE_OTHER)

## 2022-08-11 DIAGNOSIS — L501 Idiopathic urticaria: Secondary | ICD-10-CM

## 2022-08-24 ENCOUNTER — Ambulatory Visit (INDEPENDENT_AMBULATORY_CARE_PROVIDER_SITE_OTHER)

## 2022-08-24 DIAGNOSIS — L501 Idiopathic urticaria: Secondary | ICD-10-CM | POA: Diagnosis not present

## 2022-09-07 ENCOUNTER — Ambulatory Visit

## 2022-09-08 ENCOUNTER — Ambulatory Visit (INDEPENDENT_AMBULATORY_CARE_PROVIDER_SITE_OTHER)

## 2022-09-08 DIAGNOSIS — L501 Idiopathic urticaria: Secondary | ICD-10-CM

## 2022-09-21 ENCOUNTER — Ambulatory Visit

## 2022-09-28 ENCOUNTER — Ambulatory Visit (INDEPENDENT_AMBULATORY_CARE_PROVIDER_SITE_OTHER): Admitting: *Deleted

## 2022-09-28 DIAGNOSIS — L501 Idiopathic urticaria: Secondary | ICD-10-CM

## 2022-10-13 ENCOUNTER — Ambulatory Visit

## 2022-10-15 ENCOUNTER — Ambulatory Visit (INDEPENDENT_AMBULATORY_CARE_PROVIDER_SITE_OTHER)

## 2022-10-15 DIAGNOSIS — L501 Idiopathic urticaria: Secondary | ICD-10-CM | POA: Diagnosis not present

## 2022-10-30 ENCOUNTER — Ambulatory Visit (INDEPENDENT_AMBULATORY_CARE_PROVIDER_SITE_OTHER)

## 2022-10-30 DIAGNOSIS — L501 Idiopathic urticaria: Secondary | ICD-10-CM

## 2022-11-13 ENCOUNTER — Ambulatory Visit (INDEPENDENT_AMBULATORY_CARE_PROVIDER_SITE_OTHER)

## 2022-11-13 DIAGNOSIS — L501 Idiopathic urticaria: Secondary | ICD-10-CM | POA: Diagnosis not present

## 2022-11-13 MED ORDER — OMALIZUMAB 150 MG ~~LOC~~ SOLR
300.0000 mg | SUBCUTANEOUS | Status: AC
  Administered 2022-11-13 – 2024-08-17 (×41): 300 mg via SUBCUTANEOUS

## 2022-11-26 ENCOUNTER — Ambulatory Visit (INDEPENDENT_AMBULATORY_CARE_PROVIDER_SITE_OTHER)

## 2022-11-26 DIAGNOSIS — L501 Idiopathic urticaria: Secondary | ICD-10-CM | POA: Diagnosis not present

## 2022-11-27 ENCOUNTER — Other Ambulatory Visit: Payer: Self-pay | Admitting: *Deleted

## 2022-11-27 MED ORDER — XOLAIR 150 MG ~~LOC~~ SOLR: SUBCUTANEOUS | 3 refills | Status: DC

## 2022-12-10 ENCOUNTER — Ambulatory Visit

## 2022-12-11 ENCOUNTER — Ambulatory Visit (INDEPENDENT_AMBULATORY_CARE_PROVIDER_SITE_OTHER)

## 2022-12-11 DIAGNOSIS — L501 Idiopathic urticaria: Secondary | ICD-10-CM

## 2022-12-24 ENCOUNTER — Ambulatory Visit (INDEPENDENT_AMBULATORY_CARE_PROVIDER_SITE_OTHER)

## 2022-12-24 DIAGNOSIS — L501 Idiopathic urticaria: Secondary | ICD-10-CM

## 2023-01-05 ENCOUNTER — Other Ambulatory Visit: Payer: Self-pay | Admitting: Allergy

## 2023-01-06 ENCOUNTER — Ambulatory Visit

## 2023-01-19 ENCOUNTER — Ambulatory Visit

## 2023-01-19 DIAGNOSIS — L501 Idiopathic urticaria: Secondary | ICD-10-CM

## 2023-02-01 ENCOUNTER — Ambulatory Visit (INDEPENDENT_AMBULATORY_CARE_PROVIDER_SITE_OTHER): Admitting: *Deleted

## 2023-02-01 DIAGNOSIS — L501 Idiopathic urticaria: Secondary | ICD-10-CM

## 2023-02-02 ENCOUNTER — Ambulatory Visit

## 2023-02-12 ENCOUNTER — Ambulatory Visit
Admission: EM | Admit: 2023-02-12 | Discharge: 2023-02-12 | Disposition: A | Discharge status: Home / Self Care | Attending: Emergency Medicine | Admitting: Emergency Medicine

## 2023-02-12 ENCOUNTER — Ambulatory Visit (INDEPENDENT_AMBULATORY_CARE_PROVIDER_SITE_OTHER)

## 2023-02-12 DIAGNOSIS — M25552 Pain in left hip: Secondary | ICD-10-CM

## 2023-02-12 DIAGNOSIS — R1032 Left lower quadrant pain: Secondary | ICD-10-CM | POA: Diagnosis not present

## 2023-02-12 LAB — POCT URINALYSIS DIP (MANUAL ENTRY)
Bilirubin, UA: NEGATIVE
Glucose, UA: NEGATIVE mg/dL
Ketones, POC UA: NEGATIVE mg/dL
Leukocytes, UA: NEGATIVE
Nitrite, UA: NEGATIVE
Protein Ur, POC: NEGATIVE mg/dL
Spec Grav, UA: 1.015 (ref 1.010–1.025)
Urobilinogen, UA: 0.2 E.U./dL
pH, UA: 7 (ref 5.0–8.0)

## 2023-02-12 LAB — POCT URINE PREGNANCY: Preg Test, Ur: NEGATIVE

## 2023-02-12 MED ORDER — METHOCARBAMOL 500 MG PO TABS: 500.0000 mg | ORAL_TABLET | Freq: Two times a day (BID) | ORAL | 0 refills | Status: DC | PRN

## 2023-02-12 NOTE — Discharge Instructions (Addendum)
Follow-up with your primary care provider.  Go to the emergency department if you have persistent or worsening symptoms.    Take the muscle relaxer as directed.  Do not drive, operate machinery, drink alcohol, or perform dangerous activities while taking this medication as it may cause drowsiness.

## 2023-02-12 NOTE — ED Triage Notes (Signed)
Patient to Urgent Care with complaints of left sided groin pain that started yesterday morning. Reports waking up with pain, went to work and pain gradually worsened. Denies any known injury. No other symptoms.   Reports pain worse when putting pressure on her leg.   Has been taking Tylenol.

## 2023-02-12 NOTE — ED Provider Notes (Signed)
Marie Jimenez    CSN: [id number] Arrival date & time: 02/12/23  0859      History   Chief Complaint Chief Complaint  Patient presents with   Groin Pain    HPI Marie Jimenez is a 47 y.o. female.  Patient presents with left groin pain since yesterday.  No falls or injury.  The pain has gradually gotten worse.  It is worse with weightbearing and ambulation and movement.  She states it feels like her "hip is out of joint."  No numbness, weakness, bruising, redness, wounds, swelling, fever, chills, abdominal pain, dysuria, vaginal discharge, pelvic pain, or other symptoms.  Treating pain with Tylenol.  Her medical history includes asthma, allergies, hypothyroidism.  The history is provided by the patient and medical records.    Past Medical History:  Diagnosis Date   Asthma    Hypothyroid     Patient Active Problem List   Diagnosis Date Noted   Chronic idiopathic urticaria 08/02/2018   Food allergy 08/02/2018   Mild intermittent asthma without complication 08/02/2018    Past Surgical History:  Procedure Laterality Date   APPENDECTOMY     THYROIDECTOMY     TUBAL LIGATION      OB History   No obstetric history on file.      Home Medications    Prior to Admission medications   Medication Sig Start Date End Date Taking? Authorizing Provider  methocarbamol (ROBAXIN) 500 MG tablet Take 1 tablet (500 mg total) by mouth 2 (two) times daily as needed for muscle spasms. 02/12/23  Yes Mickie Bail, NP  albuterol (PROVENTIL HFA;VENTOLIN HFA) 108 (90 Base) MCG/ACT inhaler Inhale into the lungs. 10/17/18   [provider]  EPINEPHrine 0.3 mg/0.3 mL IJ SOAJ injection Inject 0.3 mg into the muscle as needed for anaphylaxis. 12/08/21   Ellamae Sia, DO  famotidine (PEPCID) 20 MG tablet Take 1 tablet (20 mg total) by mouth 2 (two) times daily. 07/01/22   Ellamae Sia, DO  fexofenadine (ALLEGRA) 180 MG tablet Take 1 tablet (180 mg total) by mouth in the morning and at  bedtime. 07/01/22   Ellamae Sia, DO  hydrOXYzine (ATARAX) 25 MG tablet TAKE 1 TABLET(25 MG) BY MOUTH THREE TIMES DAILY AS NEEDED FOR ITCHING 01/05/23   Ellamae Sia, DO  levothyroxine (SYNTHROID, LEVOTHROID) 112 MCG tablet Take 112 mcg by mouth daily. 04/19/18   [provider]  meclizine (ANTIVERT) 50 MG tablet Take 1/2 to 1 tablet 3 times daily for dizziness as needed, beware sedation 12/18/20   Sherrie Mustache Roselyn Bering, PA-C  omalizumab [name]) 150 MG injection INJECT 300 MG UNDER THE SKIN EVERY 14 DAYS 11/27/22   Alfonse Spruce, MD  triamcinolone ointment (KENALOG) 0.1 % Apply 1 Application topically 2 (two) times daily as needed (rash flare). Do not use on the face, neck, armpits or groin area. Do not use more than 3 weeks in a row. 07/01/22   Ellamae Sia, DO    Family History Family History  Problem Relation Age of Onset   Allergic rhinitis Neg Hx    Asthma Neg Hx     Social History Social History   Tobacco Use   Smoking status: Never   Smokeless tobacco: Never  Vaping Use   Vaping status: Never Used  Substance Use Topics   Alcohol use: Not Currently   Drug use: No     Allergies   Amoxicillin and Shellfish allergy   Review of Systems Review  of Systems  Constitutional:  Negative for chills and fever.  Gastrointestinal:  Negative for abdominal pain, constipation, diarrhea, nausea and vomiting.  Genitourinary:  Negative for dysuria, flank pain, frequency, hematuria, pelvic pain and vaginal discharge.  Musculoskeletal:  Positive for arthralgias and gait problem. Negative for back pain and joint swelling.  Skin:  Negative for color change, rash and wound.  Neurological:  Negative for weakness and numbness.  All other systems reviewed and are negative.    Physical Exam Triage Vital Signs ED Triage Vitals  Encounter Vitals Group     BP --      Systolic BP Percentile --      Diastolic BP Percentile --      Pulse Rate 02/12/23 0912 87     Resp 02/12/23 0912 18      Temp 02/12/23 0912 97.8 F (36.6 C)     Temp src --      SpO2 02/12/23 0912 98 %     Weight --      Height --      Head Circumference --      Peak Flow --      Pain Score 02/12/23 0913 8     Pain Loc --      Pain Education --      Exclude from Growth Chart --    No data found.  Updated Vital Signs BP 107/72   Pulse 87   Temp 97.8 F (36.6 C)   Resp 18   LMP 01/21/2023   SpO2 98%   Visual Acuity Right Eye Distance:   Left Eye Distance:   Bilateral Distance:    Right Eye Near:   Left Eye Near:    Bilateral Near:     Physical Exam   UC Treatments / Results  Labs (all labs ordered are listed, but only abnormal results are displayed) Labs Reviewed  POCT URINALYSIS DIP (MANUAL ENTRY) - Abnormal; Notable for the following components:      Result Value   Blood, UA trace-intact (*)    All other components within normal limits  POCT URINE PREGNANCY    EKG   Radiology No results found.  Procedures Procedures (including critical care time)  Medications Ordered in UC Medications - No data to display  Initial Impression / Assessment and Plan / UC Course  I have reviewed the triage vital signs and the nursing notes.  Pertinent labs & imaging results that were available during my care of the patient were reviewed by me and considered in my medical decision making (see chart for details).    Left hip pain, left groin pain.  Patient states the pain started in her left groin/hip without any known cause or injury.  It has gotten progressively worse and is worse with ambulation and weightbearing.  X-ray of hip negative.  Urine does not show signs of infection.  Vital signs are stable.  Per her request, crutches provided today.  Treating with methocarbamol; precautions for drowsiness with this medication discussed.  Instructed patient to follow-up with her PCP.  ED precautions discussed.  Education provided on hip pain.  She agrees to plan of care.  Final Clinical  Impressions(s) / UC Diagnoses   Final diagnoses:  Left hip pain  Groin pain, left     Discharge Instructions      Follow-up with your primary care provider.  Go to the emergency department if you have persistent or worsening symptoms.    Take the muscle relaxer as directed.  Do not drive, operate machinery, drink alcohol, or perform dangerous activities while taking this medication as it may cause drowsiness.       ED Prescriptions     Medication Sig Dispense Auth. Provider   methocarbamol (ROBAXIN) 500 MG tablet Take 1 tablet (500 mg total) by mouth 2 (two) times daily as needed for muscle spasms. 10 tablet Mickie Bail, NP      PDMP not reviewed this encounter.   Mickie Bail, NP 02/12/23 1045

## 2023-02-16 ENCOUNTER — Ambulatory Visit (INDEPENDENT_AMBULATORY_CARE_PROVIDER_SITE_OTHER)

## 2023-02-16 DIAGNOSIS — L501 Idiopathic urticaria: Secondary | ICD-10-CM | POA: Diagnosis not present

## 2023-03-02 ENCOUNTER — Ambulatory Visit

## 2023-03-06 ENCOUNTER — Emergency Department

## 2023-03-06 ENCOUNTER — Emergency Department
Admission: EM | Admit: 2023-03-06 | Discharge: 2023-03-06 | Disposition: A | Discharge status: Home / Self Care | Attending: Emergency Medicine | Admitting: Emergency Medicine

## 2023-03-06 ENCOUNTER — Other Ambulatory Visit: Payer: Self-pay

## 2023-03-06 ENCOUNTER — Encounter: Payer: Self-pay | Admitting: *Deleted

## 2023-03-06 DIAGNOSIS — M5442 Lumbago with sciatica, left side: Secondary | ICD-10-CM | POA: Diagnosis not present

## 2023-03-06 DIAGNOSIS — M25552 Pain in left hip: Secondary | ICD-10-CM | POA: Diagnosis present

## 2023-03-06 DIAGNOSIS — M5432 Sciatica, left side: Secondary | ICD-10-CM

## 2023-03-06 MED ORDER — KETOROLAC TROMETHAMINE 30 MG/ML IJ SOLN
30.0000 mg | Freq: Once | INTRAMUSCULAR | Status: AC
  Administered 2023-03-06: 30 mg via INTRAMUSCULAR
  Filled 2023-03-06: qty 1

## 2023-03-06 MED ORDER — METHOCARBAMOL 500 MG PO TABS
500.0000 mg | ORAL_TABLET | Freq: Three times a day (TID) | ORAL | 0 refills | Status: DC | PRN
Start: 2023-03-06 — End: 2023-12-05

## 2023-03-06 NOTE — ED Provider Notes (Signed)
[facility] Provider Note    Event Date/Time   First MD Initiated Contact with Patient 03/06/23 0406     (approximate)   History   Hip Pain   HPI  Marie Jimenez is a 47 y.o. female who presents to the ED for evaluation of Hip Pain   Review an urgent care visit from 7/26 where she was seen for the same.  Reassuring x-rays, normal UA prescribed at the [facility]  Patient presents for evaluation of 2 months of atraumatic left hip pain.  She reports it feels like her hip is going in and out of the joint.  Pain radiates from her lateral hip around her inguinal crease. No fever, weakness or red flag features  Physical Exam   Triage Vital Signs: ED Triage Vitals  Encounter Vitals Group     BP 03/06/23 0105 (!) 135/91     Systolic BP Percentile --      Diastolic BP Percentile --      Pulse Rate 03/06/23 0105 76     Resp 03/06/23 0105 16     Temp 03/06/23 0105 98.8 F (37.1 C)     Temp Source 03/06/23 0105 Oral     SpO2 03/06/23 0105 98 %     Weight --      Height --      Head Circumference --      Peak Flow --      Pain Score 03/06/23 0103 7     Pain Loc --      Pain Education --      Exclude from Growth Chart --     Most recent vital signs: Vitals:   03/06/23 0501 03/06/23 0503  BP: (!) 124/93   Pulse: 69 65  Resp:    Temp:    SpO2: 96% 100%    General: Awake, no distress.  CV:  Good peripheral perfusion.  Resp:  Normal effort.  Abd:  No distention.  MSK:  No deformity noted.  Tenderness to the lateral paraspinal lumbar back without overlying skin changes or signs of trauma. Neuro:  No focal deficits appreciated. Other:     ED Results / Procedures / Treatments   Labs (all labs ordered are listed, but only abnormal results are displayed) Labs Reviewed - No data to display  EKG   RADIOLOGY Plain film of the pelvis and left hip interpreted by me without evidence of fracture or dislocation  Official radiology report(s): DG  Hip Unilat With Pelvis 2-3 Views Left  Result Date: 03/06/2023 CLINICAL DATA:  Left hip pain. EXAM: DG HIP (WITH OR WITHOUT PELVIS) 2-3V LEFT COMPARISON:  February 12, 2023 FINDINGS: There is no evidence of hip fracture or dislocation. There is no evidence of arthropathy or other focal bone abnormality. IMPRESSION: Negative. Electronically Signed   By: Aram Candela M.D.   On: 03/06/2023 01:40    PROCEDURES and INTERVENTIONS:  Procedures  Medications  ketorolac (TORADOL) 30 MG/ML injection 30 mg (30 mg Intramuscular Given 03/06/23 0454)     IMPRESSION / MDM / ASSESSMENT AND PLAN / ED COURSE  I reviewed the triage vital signs and the nursing notes.  Differential diagnosis includes, but is not limited to, cauda equina, sciatica, pubic or pelvic fracture  Obese patient presents with chronic atraumatic left hip pain that may represent sciatica, likely MSK etiology and suitable for outpatient management.  Referred to orthopedics and provided rehab exercises       FINAL CLINICAL IMPRESSION(S) / ED  DIAGNOSES   Final diagnoses:  Left hip pain  Sciatica of left side     Rx / DC Orders   ED Discharge Orders          Ordered    methocarbamol (ROBAXIN) 500 MG tablet  Every 8 hours PRN        03/06/23 0459             Note:  This document was prepared using Dragon voice recognition software and may include unintentional dictation errors.   Delton Prairie, MD 03/06/23 [phone]

## 2023-03-06 NOTE — Discharge Instructions (Signed)
Please take Tylenol and ibuprofen/Advil for your pain.  It is safe to take them together, or to alternate them every few hours.  Take up to 1000mg  of Tylenol at a time, up to 4 times per day.  Do not take more than 4000 mg of Tylenol in 24 hours.  For ibuprofen, take 400-600 mg, 3 - 4 times per day.  Use Robaxin muscle relaxer as needed for more severe/breakthrough pain, up to 3 times per day. This medication can make some people sleepy, so do not use while driving, working or Designer, television/film set

## 2023-03-06 NOTE — ED Triage Notes (Addendum)
Pt ambulatory to triage, says her left hip "keeps popping out". Pain increased and feels like she is dragging her leg. Has been seen recently for the same, would like to have an MRI of her hip. Taking meloxicam without relief.

## 2023-03-16 ENCOUNTER — Ambulatory Visit: Admitting: *Deleted

## 2023-03-16 DIAGNOSIS — L501 Idiopathic urticaria: Secondary | ICD-10-CM

## 2023-03-17 ENCOUNTER — Other Ambulatory Visit: Payer: Self-pay | Admitting: Allergy

## 2023-03-30 ENCOUNTER — Ambulatory Visit (INDEPENDENT_AMBULATORY_CARE_PROVIDER_SITE_OTHER)

## 2023-03-30 DIAGNOSIS — L501 Idiopathic urticaria: Secondary | ICD-10-CM | POA: Diagnosis not present

## 2023-04-13 ENCOUNTER — Ambulatory Visit (INDEPENDENT_AMBULATORY_CARE_PROVIDER_SITE_OTHER)

## 2023-04-13 DIAGNOSIS — L501 Idiopathic urticaria: Secondary | ICD-10-CM | POA: Diagnosis not present

## 2023-04-23 ENCOUNTER — Ambulatory Visit: Payer: Self-pay

## 2023-04-27 ENCOUNTER — Ambulatory Visit

## 2023-05-11 ENCOUNTER — Ambulatory Visit: Admitting: *Deleted

## 2023-05-11 DIAGNOSIS — L501 Idiopathic urticaria: Secondary | ICD-10-CM | POA: Diagnosis not present

## 2023-05-25 ENCOUNTER — Ambulatory Visit: Admitting: *Deleted

## 2023-05-25 DIAGNOSIS — L501 Idiopathic urticaria: Secondary | ICD-10-CM | POA: Diagnosis not present

## 2023-05-26 ENCOUNTER — Ambulatory Visit

## 2023-06-08 ENCOUNTER — Ambulatory Visit

## 2023-06-22 ENCOUNTER — Ambulatory Visit: Admitting: *Deleted

## 2023-06-22 DIAGNOSIS — L501 Idiopathic urticaria: Secondary | ICD-10-CM | POA: Diagnosis not present

## 2023-07-06 ENCOUNTER — Ambulatory Visit: Admitting: *Deleted

## 2023-07-06 DIAGNOSIS — L501 Idiopathic urticaria: Secondary | ICD-10-CM | POA: Diagnosis not present

## 2023-07-29 ENCOUNTER — Ambulatory Visit

## 2023-07-29 DIAGNOSIS — L501 Idiopathic urticaria: Secondary | ICD-10-CM | POA: Diagnosis not present

## 2023-08-12 ENCOUNTER — Ambulatory Visit: Admitting: *Deleted

## 2023-08-12 DIAGNOSIS — L501 Idiopathic urticaria: Secondary | ICD-10-CM

## 2023-08-12 MED ORDER — EPINEPHRINE 0.3 MG/0.3ML IJ SOAJ: 0.3000 mg | INTRAMUSCULAR | 1 refills | Status: DC | PRN

## 2023-08-31 ENCOUNTER — Ambulatory Visit: Admitting: *Deleted

## 2023-08-31 DIAGNOSIS — L501 Idiopathic urticaria: Secondary | ICD-10-CM | POA: Diagnosis not present

## 2023-09-14 ENCOUNTER — Ambulatory Visit (INDEPENDENT_AMBULATORY_CARE_PROVIDER_SITE_OTHER): Admitting: *Deleted

## 2023-09-14 DIAGNOSIS — L501 Idiopathic urticaria: Secondary | ICD-10-CM | POA: Diagnosis not present

## 2023-09-28 ENCOUNTER — Ambulatory Visit: Admitting: *Deleted

## 2023-09-28 DIAGNOSIS — L501 Idiopathic urticaria: Secondary | ICD-10-CM | POA: Diagnosis not present

## 2023-10-12 ENCOUNTER — Ambulatory Visit

## 2023-10-13 ENCOUNTER — Encounter: Payer: Self-pay | Admitting: Allergy

## 2023-10-13 ENCOUNTER — Ambulatory Visit (INDEPENDENT_AMBULATORY_CARE_PROVIDER_SITE_OTHER): Admitting: Allergy

## 2023-10-13 ENCOUNTER — Other Ambulatory Visit: Payer: Self-pay

## 2023-10-13 ENCOUNTER — Ambulatory Visit

## 2023-10-13 VITALS — BP 128/80 | HR 79 | Temp 98.2°F | Resp 16 | Ht 68.11 in | Wt 272.9 lb

## 2023-10-13 DIAGNOSIS — L501 Idiopathic urticaria: Secondary | ICD-10-CM | POA: Diagnosis not present

## 2023-10-13 DIAGNOSIS — J4520 Mild intermittent asthma, uncomplicated: Secondary | ICD-10-CM | POA: Diagnosis not present

## 2023-10-13 DIAGNOSIS — R0602 Shortness of breath: Secondary | ICD-10-CM

## 2023-10-13 DIAGNOSIS — T7800XD Anaphylactic reaction due to unspecified food, subsequent encounter: Secondary | ICD-10-CM | POA: Diagnosis not present

## 2023-10-13 DIAGNOSIS — T7800XA Anaphylactic reaction due to unspecified food, initial encounter: Secondary | ICD-10-CM

## 2023-10-13 NOTE — Patient Instructions (Addendum)
 Hives: Continue Xolair 300mg  every 2 weeks - given today.   If you have a flare:  Start allegra (fexofenadine) 180mg  twice a day. If symptoms are not controlled or causes drowsiness let us know. Start pepcid (famotidine) 20mg  twice a day.  May use hydroxyzine 25mg  every 8 hours as needed for itching. Avoid the following potential triggers: alcohol, tight clothing, NSAIDs, hot showers and getting overheated. Use triamcinolone 0.1% ointment twice a day as needed for rash flares. Do not use on the face, neck, armpits or groin area. Do not use more than 3 weeks in a row.   Asthma May use albuterol rescue inhaler 2 puffs every 4 to 6 hours as needed for shortness of breath, chest tightness, coughing, and wheezing. May use albuterol rescue inhaler 2 puffs 5 to 15 minutes prior to strenuous physical activities. Monitor frequency of use - if you need to use it more than twice per week on a consistent basis let us know.   Food allergy Continue strict avoidance of seafood. For mild symptoms you can take over the counter antihistamines such as Benadryl 1-2 tablets = 25-50mg  and monitor symptoms closely. If symptoms worsen or if you have severe symptoms including breathing issues, throat closure, significant swelling, whole body hives, severe diarrhea and vomiting, lightheadedness then inject epinephrine and seek immediate medical care afterwards. Emergency action plan in place.   Shortness of breath Please follow up with PCP or surgeon regarding this.   Follow up in 6 months or sooner if needed.

## 2023-10-13 NOTE — Progress Notes (Signed)
 Follow Up Note  RE: Marie Jimenez MRN: [id number] DOB: 08/18/1975 Date of Office Visit: 10/13/2023  Referring provider: Macy Mis, MD Primary care provider: Macy Mis, MD  Chief Complaint: Urticaria and Medication Management [name])  History of Present Illness: I had the pleasure of seeing Marie Jimenez for a follow up visit at the [facility] on 10/13/2023. She is a 48 y.o. 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 Her previous allergy office visit was on 07/01/2022 with Dr. Selena Batten. Today is a regular follow up visit.  Discussed the use of AI scribe software for clinical note transcription with the patient, who gave verbal consent to proceed.    She has been receiving Xolair 300mg  injections every two weeks for the past year, which has significantly reduced her symptoms of chronic urticaria. Initially, she received the injections monthly, then every three weeks, and now every two weeks, which she describes as 'working perfect.' Despite this, she still experiences occasional itching. She occasionally takes hydroxyzine (Atarax) for itching but has not required a steroid shot recently. She has not experienced any significant issues or reactions since her last visit and carries an EpiPen, though she has not needed to use it.  Regarding her asthma, it has been well-controlled until about two weeks ago when she began experiencing shortness of breath. She has used her albuterol inhaler three times this week, which is more frequent than usual. The shortness of breath occurs even when at rest, such as while lying in bed watching TV. No recent illness or cold has been reported. Albuterol does not seem to help.  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 Denies any leg calf tenderness or swelling.     Assessment and  Plan: [name] is a 48 y.o. female with: Chronic idiopathic urticaria Well controlled with current regimen. Every 3-4 week regiment for Xolair was ineffective in the past. Continue Xolair 300mg  every 2 weeks - given today.  If you have a flare:  Start allegra (fexofenadine) 180mg  twice a day. If symptoms are not controlled or causes drowsiness let us know. Start Pepcid (famotidine) 20mg  twice a day.  May use hydroxyzine 25mg  every 8 hours as needed for itching. Avoid the following potential triggers: alcohol, tight clothing, NSAIDs, hot showers and getting overheated. Use triamcinolone 0.1% ointment twice a day as needed for rash flares. Do not use on the face, neck, armpits or groin area. Do not use more than 3 weeks in a row.    Mild intermittent asthma without complication Shortness of breath Past history - 2022 normal spirometry. Interim history - increased shortness of breath and using albuterol without much benefit. No calf tenderness/swelling.   Today's spirometry was unremarkable. Advised patient to follow up with PCP or surgeon regarding her shortness of breath due to recent surgical procedure to rule out possible PE. She has been going to physical therapy and walking around more.  May use albuterol rescue inhaler 2 puffs every 4 to 6 hours as needed for shortness of breath, chest tightness, coughing, and wheezing. May use albuterol rescue inhaler 2 puffs 5 to 15 minutes prior to strenuous physical activities. Monitor frequency of use - if you need to use it more than twice per week on a consistent basis let us know.    Food allergy Past history -  2020 skin testing was positive to shellfish and mollusks. Continue strict avoidance of seafood. For mild symptoms you can take over the counter antihistamines such as Benadryl 1-2 tablets = 25-50mg  and monitor symptoms closely. If symptoms worsen or if you have severe symptoms including breathing issues, throat closure, significant  swelling, whole body hives, severe diarrhea and vomiting, lightheadedness then inject epinephrine and seek immediate medical care afterwards. Emergency action plan in place.   Return in about 6 months (around 04/14/2024).  No orders of the defined types were placed in this encounter.  Lab Orders  No laboratory test(s) ordered today    Diagnostics: Spirometry:  Tracings reviewed. Her effort: It was hard to get consistent efforts and there is a question as to whether this reflects a maximal maneuver. FVC: 3.84L FEV1: 2.34L, 81% predicted FEV1/FVC ratio: 61% Interpretation: No overt abnormalities noted given today's efforts.  Please see scanned spirometry results for details.  Results discussed with patient/family.   Medication List:  Current Outpatient Medications  Medication Sig Dispense Refill   albuterol (PROVENTIL HFA;VENTOLIN HFA) 108 (90 Base) MCG/ACT inhaler Inhale into the lungs.     EPINEPHrine (EPIPEN 2-PAK) 0.3 mg/0.3 mL IJ SOAJ injection Inject 0.3 mg into the muscle as needed for anaphylaxis. 0.3 mL 1   EPINEPHrine 0.3 mg/0.3 mL IJ SOAJ injection Inject 0.3 mg into the muscle as needed for anaphylaxis. 1 each 2   famotidine (PEPCID) 20 MG tablet Take 1 tablet (20 mg total) by mouth 2 (two) times daily. 60 tablet 2   fexofenadine (ALLEGRA) 180 MG tablet Take 1 tablet (180 mg total) by mouth in the morning and at bedtime. 60 tablet 2   hydrOXYzine (ATARAX) 25 MG tablet TAKE 1 TABLET(25 MG) BY MOUTH THREE TIMES DAILY AS NEEDED FOR ITCHING 90 tablet 1   levothyroxine (SYNTHROID, LEVOTHROID) 112 MCG tablet Take 112 mcg by mouth daily.     methocarbamol (ROBAXIN) 500 MG tablet Take 1 tablet (500 mg total) by mouth 2 (two) times daily as needed for muscle spasms. 10 tablet 0   methocarbamol (ROBAXIN) 500 MG tablet Take 1 tablet (500 mg total) by mouth every 8 (eight) hours as needed. 30 tablet 0   omalizumab (XOLAIR) 150 MG injection INJECT 300 MG UNDER THE SKIN EVERY 14 DAYS 12  each 3   triamcinolone ointment (KENALOG) 0.1 % Apply 1 Application topically 2 (two) times daily as needed (rash flare). Do not use on the face, neck, armpits or groin area. Do not use more than 3 weeks in a row. 30 g 2   Current Facility-Administered Medications  Medication Dose Route Frequency Provider Last Rate Last Admin   omalizumab [name]) injection 300 mg  300 mg Subcutaneous Q14 Days Ellamae Sia, DO   300 mg at 10/13/23 1116   Allergies: Allergies  Allergen Reactions   Amoxicillin Swelling   Shellfish Allergy Swelling   I reviewed her past medical history, social history, family history, and environmental history and no significant changes have been reported from her previous visit.  Review of Systems  Constitutional:  Negative for appetite change, chills, fever and unexpected weight change.  HENT:  Negative for congestion and rhinorrhea.   Eyes:  Negative for itching.  Respiratory:  Positive for shortness of breath. Negative for cough, chest tightness and wheezing.   Gastrointestinal:  Negative for abdominal pain.  Skin:  Negative for rash.       pruritus  Neurological:  Negative for headaches.    Objective: BP 128/80 (  BP Location: Right Arm, Patient Position: Sitting, Cuff Size: Normal)   Pulse 79   Temp 98.2 F (36.8 C) (Temporal)   Resp 16   Ht 5' 8.11" (1.73 m)   Wt 272 lb 14.4 oz (123.8 kg)   SpO2 98%   BMI 41.36 kg/m  Body mass index is 41.36 kg/m. Physical Exam Vitals and nursing note reviewed.  Constitutional:      Appearance: Normal appearance. She is well-developed.  HENT:     Head: Normocephalic and atraumatic.     Right Ear: Tympanic membrane and external ear normal.     Left Ear: Tympanic membrane and external ear normal.     Nose: Nose normal.     Mouth/Throat:     Mouth: Mucous membranes are moist.     Pharynx: Oropharynx is clear.  Eyes:     Conjunctiva/sclera: Conjunctivae normal.  Cardiovascular:     Rate and Rhythm: Normal rate and  regular rhythm.     Heart sounds: Normal heart sounds. No murmur heard. Pulmonary:     Effort: Pulmonary effort is normal.     Breath sounds: Normal breath sounds. No wheezing, rhonchi or rales.  Musculoskeletal:     Cervical back: Neck supple.     Right lower leg: No edema.     Left lower leg: No edema.  Skin:    General: Skin is warm.     Findings: No rash.  Neurological:     Mental Status: She is alert and oriented to person, place, and time.  Psychiatric:        Behavior: Behavior normal.   Previous notes and tests were reviewed. The plan was reviewed with the patient/family, and all questions/concerned were addressed.  It was my pleasure to see Tanyla today and participate in her care. Please feel free to contact me with any questions or concerns.  Sincerely,  Wyline Mood, DO Allergy & Immunology  [facility] office: [phone] [facility] office: [phone]

## 2023-10-28 ENCOUNTER — Ambulatory Visit

## 2023-10-28 DIAGNOSIS — L501 Idiopathic urticaria: Secondary | ICD-10-CM

## 2023-11-11 ENCOUNTER — Ambulatory Visit

## 2023-11-11 DIAGNOSIS — L501 Idiopathic urticaria: Secondary | ICD-10-CM

## 2023-11-12 ENCOUNTER — Ambulatory Visit

## 2023-11-16 ENCOUNTER — Other Ambulatory Visit: Payer: Self-pay | Admitting: Allergy & Immunology

## 2023-11-25 ENCOUNTER — Ambulatory Visit

## 2023-11-25 DIAGNOSIS — L501 Idiopathic urticaria: Secondary | ICD-10-CM | POA: Diagnosis not present

## 2023-12-05 ENCOUNTER — Ambulatory Visit: Admission: EM | Admit: 2023-12-05 | Discharge: 2023-12-05 | Disposition: A | Discharge status: Home / Self Care

## 2023-12-05 DIAGNOSIS — M5442 Lumbago with sciatica, left side: Secondary | ICD-10-CM

## 2023-12-05 MED ORDER — KETOROLAC TROMETHAMINE 30 MG/ML IJ SOLN
30.0000 mg | Freq: Once | INTRAMUSCULAR | Status: AC
  Administered 2023-12-05: 30 mg via INTRAMUSCULAR

## 2023-12-05 MED ORDER — CYCLOBENZAPRINE HCL 10 MG PO TABS: 10.0000 mg | ORAL_TABLET | Freq: Two times a day (BID) | ORAL | 0 refills | Status: AC | PRN

## 2023-12-05 NOTE — ED Triage Notes (Signed)
 Patient to Urgent Care with complaints of back pain. Worse when she moves her left leg. Hx of sciatica.  Symptoms started two days ago. Believes it could be related to a hotel bed.   Meds: reports she had one flexeril left from previous prescription (took dose at 12pm).

## 2023-12-05 NOTE — Discharge Instructions (Addendum)
 You were given an injection of ketorolac  (Toradol ).  Do not take any NSAIDs for the rest of today (ibuprofen, Advil, Aleve, Motrin, etc.).  You can take Tylenol if needed.    A prescription for Flexeril was sent to your pharmacy.  Do not drive, operate machinery, drink alcohol, or perform dangerous activities while taking this medication as it may cause drowsiness.  Follow-up with your primary care provider if your symptoms are not improving.

## 2023-12-05 NOTE — ED Provider Notes (Signed)
 Marie Jimenez    CSN: [id number] Arrival date & time: 12/05/23  1435      History   Chief Complaint Chief Complaint  Patient presents with   Back Pain    HPI Marie Jimenez is a 48 y.o. female.  Patient presents with low back pain x 2 days.  No falls or injury.  She attributes it to sleeping in a hotel bed.  The pain radiates down her left leg.  It is worse with movement and ambulation; improves with rest.  She took 1 tablet of cyclobenzaprine that was leftover from previous illness.  No NSAIDs taken today.  She denies numbness, weakness, saddle anesthesia, loss of bowel/bladder control, abdominal pain, dysuria, hematuria, rash, bruising, lesions.  She states her left hip is doing well and not painful.  Her medical history includes hip replacement in February 2025.  The history is provided by the patient and medical records.    Past Medical History:  Diagnosis Date   Asthma    Hypothyroid     Patient Active Problem List   Diagnosis Date Noted   Chronic idiopathic urticaria 08/02/2018   Food allergy  08/02/2018   Mild intermittent asthma without complication 08/02/2018    Past Surgical History:  Procedure Laterality Date   APPENDECTOMY     THYROIDECTOMY     TUBAL LIGATION      OB History   No obstetric history on file.      Home Medications    Prior to Admission medications   Medication Sig Start Date End Date Taking? Authorizing Provider  cyclobenzaprine (FLEXERIL) 10 MG tablet Take 1 tablet (10 mg total) by mouth 2 (two) times daily as needed for muscle spasms. 12/05/23  Yes Wellington Half, NP  oxyCODONE (OXY IR/ROXICODONE) 5 MG immediate release tablet Take 1 tablet every 4 hours by oral route. 08/31/23  Yes [provider]  albuterol (PROVENTIL HFA;VENTOLIN HFA) 108 (90 Base) MCG/ACT inhaler Inhale into the lungs. 10/17/18   [provider]  EPINEPHrine  (EPIPEN  2-PAK) 0.3 mg/0.3 mL IJ SOAJ injection Inject 0.3 mg into the muscle as  needed for anaphylaxis. 08/12/23   Trudy Fusi, DO  EPINEPHrine  0.3 mg/0.3 mL IJ SOAJ injection Inject 0.3 mg into the muscle as needed for anaphylaxis. 12/08/21   Trudy Fusi, DO  famotidine  (PEPCID ) 20 MG tablet Take 1 tablet (20 mg total) by mouth 2 (two) times daily. 07/01/22   Trudy Fusi, DO  fexofenadine  (ALLEGRA ) 180 MG tablet Take 1 tablet (180 mg total) by mouth in the morning and at bedtime. 07/01/22   Trudy Fusi, DO  hydrOXYzine  (ATARAX ) 25 MG tablet TAKE 1 TABLET(25 MG) BY MOUTH THREE TIMES DAILY AS NEEDED FOR ITCHING 01/05/23   Trudy Fusi, DO  levothyroxine (SYNTHROID, LEVOTHROID) 112 MCG tablet Take 112 mcg by mouth daily. 04/19/18   [provider]  omalizumab  (XOLAIR ) 150 MG injection INJECT 300 MG UNDER THE SKIN EVERY 2 WEEKS (MAY REQUIRE RECONSTITUTION/DILUTION, REFER TO PACKAGE INSERT OR MEDICAL ORDER) 11/16/23   Rochester Chuck, MD  triamcinolone  ointment (KENALOG ) 0.1 % Apply 1 Application topically 2 (two) times daily as needed (rash flare). Do not use on the face, neck, armpits or groin area. Do not use more than 3 weeks in a row. 07/01/22   Trudy Fusi, DO    Family History Family History  Problem Relation Age of Onset   Allergic rhinitis Neg Hx    Asthma Neg Hx  Social History Social History   Tobacco Use   Smoking status: Never   Smokeless tobacco: Never  Vaping Use   Vaping status: Never Used  Substance Use Topics   Alcohol use: Not Currently   Drug use: No     Allergies   Amoxicillin and Shellfish allergy    Review of Systems Review of Systems  Constitutional:  Negative for fever.  Gastrointestinal:  Negative for abdominal pain.  Genitourinary:  Negative for dysuria and hematuria.  Musculoskeletal:  Positive for back pain and gait problem. Negative for arthralgias and joint swelling.  Skin:  Negative for color change, rash and wound.  Neurological:  Negative for weakness and numbness.     Physical Exam Triage Vital Signs ED  Triage Vitals  Encounter Vitals Group     BP      Systolic BP Percentile      Diastolic BP Percentile      Pulse      Resp      Temp      Temp src      SpO2      Weight      Height      Head Circumference      Peak Flow      Pain Score      Pain Loc      Pain Education      Exclude from Growth Chart    No data found.  Updated Vital Signs BP 111/74   Pulse 100   Temp 97.8 F (36.6 C)   Resp 18   LMP 11/15/2023   SpO2 98%   Visual Acuity Right Eye Distance:   Left Eye Distance:   Bilateral Distance:    Right Eye Near:   Left Eye Near:    Bilateral Near:     Physical Exam Constitutional:      General: She is not in acute distress. HENT:     Mouth/Throat:     Mouth: Mucous membranes are moist.  Cardiovascular:     Rate and Rhythm: Normal rate and regular rhythm.  Pulmonary:     Effort: Pulmonary effort is normal.  Musculoskeletal:        General: Tenderness present. No swelling or deformity. Normal range of motion.     Comments: Mild left lower back muscular tenderness.  Spine nontender.  Skin:    General: Skin is warm and dry.     Capillary Refill: Capillary refill takes less than 2 seconds.     Findings: No bruising, erythema, lesion or rash.  Neurological:     General: No focal deficit present.     Mental Status: She is alert.     Sensory: No sensory deficit.     Motor: No weakness.     Gait: Gait normal.      UC Treatments / Results  Labs (all labs ordered are listed, but only abnormal results are displayed) Labs Reviewed - No data to display  EKG   Radiology No results found.  Procedures Procedures (including critical care time)  Medications Ordered in UC Medications  ketorolac  (TORADOL ) 30 MG/ML injection 30 mg (30 mg Intramuscular Given 12/05/23 1510)    Initial Impression / Assessment and Plan / UC Course  I have reviewed the triage vital signs and the nursing notes.  Pertinent labs & imaging results that were available during  my care of the patient were reviewed by me and considered in my medical decision making (see chart for details).  Acute left lower back pain with left-sided sciatica.  Toradol  given here per patient request.  Instructed her to avoid NSAIDs for the rest of today.  Discussed Tylenol as needed.  10 tablets of Flexeril also prescribed.  Precautions for drowsiness with Flexeril discussed.  Instructed patient to follow-up with her PCP if she is not improving.  Education provided on sciatica.  She agrees to plan of care.  Final Clinical Impressions(s) / UC Diagnoses   Final diagnoses:  Acute left-sided low back pain with left-sided sciatica     Discharge Instructions      You were given an injection of ketorolac  (Toradol ).  Do not take any NSAIDs for the rest of today (ibuprofen, Advil, Aleve, Motrin, etc.).  You can take Tylenol if needed.    A prescription for Flexeril was sent to your pharmacy.  Do not drive, operate machinery, drink alcohol, or perform dangerous activities while taking this medication as it may cause drowsiness.  Follow-up with your primary care provider if your symptoms are not improving.    ED Prescriptions     Medication Sig Dispense Auth. Provider   cyclobenzaprine (FLEXERIL) 10 MG tablet Take 1 tablet (10 mg total) by mouth 2 (two) times daily as needed for muscle spasms. 10 tablet Wellington Half, NP      I have reviewed the PDMP during this encounter.   Wellington Half, NP 12/05/23 1520

## 2023-12-09 ENCOUNTER — Ambulatory Visit (INDEPENDENT_AMBULATORY_CARE_PROVIDER_SITE_OTHER)

## 2023-12-09 DIAGNOSIS — L501 Idiopathic urticaria: Secondary | ICD-10-CM

## 2023-12-23 ENCOUNTER — Ambulatory Visit

## 2023-12-23 DIAGNOSIS — L501 Idiopathic urticaria: Secondary | ICD-10-CM | POA: Diagnosis not present

## 2023-12-28 ENCOUNTER — Emergency Department (HOSPITAL_COMMUNITY)
Admission: EM | Admit: 2023-12-28 | Discharge: 2023-12-28 | Disposition: A | Discharge status: Home / Self Care | Attending: Emergency Medicine | Admitting: Emergency Medicine

## 2023-12-28 ENCOUNTER — Other Ambulatory Visit: Payer: Self-pay

## 2023-12-28 ENCOUNTER — Emergency Department (HOSPITAL_BASED_OUTPATIENT_CLINIC_OR_DEPARTMENT_OTHER)

## 2023-12-28 ENCOUNTER — Encounter (HOSPITAL_COMMUNITY): Payer: Self-pay

## 2023-12-28 DIAGNOSIS — E039 Hypothyroidism, unspecified: Secondary | ICD-10-CM | POA: Diagnosis not present

## 2023-12-28 DIAGNOSIS — M7989 Other specified soft tissue disorders: Secondary | ICD-10-CM | POA: Diagnosis not present

## 2023-12-28 DIAGNOSIS — J45909 Unspecified asthma, uncomplicated: Secondary | ICD-10-CM | POA: Insufficient documentation

## 2023-12-28 DIAGNOSIS — M79604 Pain in right leg: Secondary | ICD-10-CM | POA: Insufficient documentation

## 2023-12-28 NOTE — Discharge Instructions (Addendum)
 Your DVT study was negative on today's visit.  Follow-up with your primary care physician as needed.

## 2023-12-28 NOTE — Progress Notes (Signed)
 RLE venous duplex has been completed.  Preliminary results given to Abigail Harris, PA-C.   Results can be found under chart review under CV PROC. 12/28/2023 4:01 PM Billiejean Schimek RVT, RDMS

## 2023-12-28 NOTE — ED Notes (Signed)
 Patient transported to Ultrasound

## 2023-12-28 NOTE — ED Provider Notes (Signed)
 [place] EMERGENCY DEPARTMENT AT [facility] Provider Note   CSN: [id number] Arrival date & time: 12/28/23  1049     History  Chief Complaint  Patient presents with   Leg Pain    right    Marie Jimenez is a 48 y.o. female.  48 y.o female with a PMH of Hypothyroid, Asthma presents to the ED with a chief complaint of right leg pain that has been ongoing for 1 week.  Patient reports she had a total left hip replacement approximately 3 months ago, she returned to work 3 weeks ago, reports she has been stationary for the most part.  However, her boss was out of town last week, therefore she did increase her activity.  She continues to endorse pain along the posterior aspect of her right leg, reports that pain originally started around her right calf and traveled all the way up her leg.  She has not taken any medication for improvement in symptoms.  She is not anticoagulated.  She does report a family history of DVTs.  No trauma, no fever, no other complaints reported.  The history is provided by the patient.  Leg Pain Associated symptoms: no back pain and no fever        Home Medications Prior to Admission medications   Medication Sig Start Date End Date Taking? Authorizing Provider  albuterol (PROVENTIL HFA;VENTOLIN HFA) 108 (90 Base) MCG/ACT inhaler Inhale into the lungs. 10/17/18   [provider]  cyclobenzaprine  (FLEXERIL ) 10 MG tablet Take 1 tablet (10 mg total) by mouth 2 (two) times daily as needed for muscle spasms. 12/05/23   Wellington Half, NP  EPINEPHrine  (EPIPEN  2-PAK) 0.3 mg/0.3 mL IJ SOAJ injection Inject 0.3 mg into the muscle as needed for anaphylaxis. 08/12/23   Trudy Fusi, DO  EPINEPHrine  0.3 mg/0.3 mL IJ SOAJ injection Inject 0.3 mg into the muscle as needed for anaphylaxis. 12/08/21   Trudy Fusi, DO  famotidine  (PEPCID ) 20 MG tablet Take 1 tablet (20 mg total) by mouth 2 (two) times daily. 07/01/22   Trudy Fusi, DO  fexofenadine  (ALLEGRA ) 180 MG  tablet Take 1 tablet (180 mg total) by mouth in the morning and at bedtime. 07/01/22   Trudy Fusi, DO  hydrOXYzine  (ATARAX ) 25 MG tablet TAKE 1 TABLET(25 MG) BY MOUTH THREE TIMES DAILY AS NEEDED FOR ITCHING 01/05/23   Trudy Fusi, DO  levothyroxine (SYNTHROID, LEVOTHROID) 112 MCG tablet Take 112 mcg by mouth daily. 04/19/18   [provider]  omalizumab  (XOLAIR ) 150 MG injection INJECT 300 MG UNDER THE SKIN EVERY 2 WEEKS (MAY REQUIRE RECONSTITUTION/DILUTION, REFER TO PACKAGE INSERT OR MEDICAL ORDER) 11/16/23   Rochester Chuck, MD  oxyCODONE (OXY IR/ROXICODONE) 5 MG immediate release tablet Take 1 tablet every 4 hours by oral route. 08/31/23   [provider]  triamcinolone  ointment (KENALOG ) 0.1 % Apply 1 Application topically 2 (two) times daily as needed (rash flare). Do not use on the face, neck, armpits or groin area. Do not use more than 3 weeks in a row. 07/01/22   Trudy Fusi, DO      Allergies    Amoxicillin and Shellfish allergy     Review of Systems   Review of Systems  Constitutional:  Negative for chills and fever.  Respiratory:  Negative for shortness of breath.   Cardiovascular:  Negative for leg swelling.  Musculoskeletal:  Positive for myalgias. Negative for back pain.  All other systems reviewed and  are negative.   Physical Exam Updated Vital Signs BP 127/75 (BP Location: Right Arm)   Pulse 80   Temp (!) 97.5 F (36.4 C) (Oral)   Resp (!) 21   Ht 5\' 10"  (1.778 m)   Wt 117.9 kg   LMP 12/05/2023   SpO2 100%   BMI 37.31 kg/m  Physical Exam Vitals and nursing note reviewed.  Constitutional:      Appearance: Normal appearance.  HENT:     Head: Normocephalic and atraumatic.     Mouth/Throat:     Mouth: Mucous membranes are moist.  Eyes:     Pupils: Pupils are equal, round, and reactive to light.  Cardiovascular:     Rate and Rhythm: Normal rate.     Pulses:          Dorsalis pedis pulses are 2+ on the right side and 2+ on the left side.        Posterior tibial pulses are 2+ on the right side and 2+ on the left side.  Pulmonary:     Effort: Pulmonary effort is normal.  Abdominal:     General: Abdomen is flat.  Musculoskeletal:     Cervical back: Normal range of motion and neck supple.  Skin:    General: Skin is warm and dry.  Neurological:     Mental Status: She is alert and oriented to person, place, and time.     Comments: RLE- KF,KE 5/5 strength LLE- HF, HE 5/5 strength Normal gait. No pronator drift. No leg drop.         ED Results / Procedures / Treatments   Labs (all labs ordered are listed, but only abnormal results are displayed) Labs Reviewed - No data to display  EKG None  Radiology No results found.  Procedures Procedures    Medications Ordered in ED Medications - No data to display  ED Course/ Medical Decision Making/ A&P Clinical Course as of 12/28/23 1520  Tue Dec 28, 2023  1513 Currently getting a DVT for study . Hip replacement 3 mos ago. If dvt negative d/c  [AH]    Clinical Course User Index [AH] Harris, Abigail, PA-C                                 Medical Decision Making   Patient presents to the ED with a chief complaint of right leg pain, this has been ongoing for the past week.  She does note prior history of a left hip replacement approximately 3 months ago.  Does report she came back to work 3 weeks ago, has had increase in her activity.  There is no redness, no swelling to the right leg.  No calf tenderness, reports she did her own "Homans signs ", and this was negative.  She does have a palpable movable lesion in the back of her right thigh.  This is tender to palpation, however no erythema visualized.  He does not have any prior history of DVT, does report this does run in her family with her mother having multiple of these.  She is currently not on any blood thinners.    Sickle examination is benign, she is neurovascularly intact with good pulses and good strength to  bilateral lower extremities.  Will obtain ultrasound to rule out DVT to her right leg.  She remains hemodynamically stable for imaging.  DVT study is negative.    Portions of this  note were generated with Scientist, clinical (histocompatibility and immunogenetics). Dictation errors may occur despite best attempts at proofreading.   Final Clinical Impression(s) / ED Diagnoses Final diagnoses:  Right leg pain    Rx / DC Orders ED Discharge Orders     None         Luellen Sages, PA-C 12/28/23 1520

## 2023-12-28 NOTE — ED Triage Notes (Signed)
 Pt to ED c/o right leg pain x 1 week. Reports surgery to left hip 3 months ago, returned back to work 3 weeks , ambulatory . Denies redness or swelling to leg

## 2024-01-04 ENCOUNTER — Ambulatory Visit (INDEPENDENT_AMBULATORY_CARE_PROVIDER_SITE_OTHER)

## 2024-01-04 DIAGNOSIS — L501 Idiopathic urticaria: Secondary | ICD-10-CM

## 2024-01-20 ENCOUNTER — Ambulatory Visit

## 2024-01-20 DIAGNOSIS — L501 Idiopathic urticaria: Secondary | ICD-10-CM

## 2024-02-03 ENCOUNTER — Ambulatory Visit

## 2024-02-03 DIAGNOSIS — L501 Idiopathic urticaria: Secondary | ICD-10-CM

## 2024-02-17 ENCOUNTER — Ambulatory Visit

## 2024-02-17 DIAGNOSIS — L501 Idiopathic urticaria: Secondary | ICD-10-CM | POA: Diagnosis not present

## 2024-02-17 MED ORDER — EPINEPHRINE 0.3 MG/0.3ML IJ SOAJ: 0.3000 mg | INTRAMUSCULAR | 1 refills | Status: AC | PRN

## 2024-02-20 ENCOUNTER — Emergency Department
Admission: EM | Admit: 2024-02-20 | Discharge: 2024-02-20 | Disposition: A | Discharge status: Home / Self Care | Attending: Emergency Medicine | Admitting: Emergency Medicine

## 2024-02-20 ENCOUNTER — Other Ambulatory Visit: Payer: Self-pay

## 2024-02-20 ENCOUNTER — Emergency Department

## 2024-02-20 DIAGNOSIS — E039 Hypothyroidism, unspecified: Secondary | ICD-10-CM | POA: Insufficient documentation

## 2024-02-20 DIAGNOSIS — J45909 Unspecified asthma, uncomplicated: Secondary | ICD-10-CM | POA: Insufficient documentation

## 2024-02-20 DIAGNOSIS — R0602 Shortness of breath: Secondary | ICD-10-CM | POA: Insufficient documentation

## 2024-02-20 DIAGNOSIS — R000 Tachycardia, unspecified: Secondary | ICD-10-CM | POA: Insufficient documentation

## 2024-02-20 LAB — BASIC METABOLIC PANEL WITH GFR
Anion gap: 11 (ref 5–15)
BUN: 13 mg/dL (ref 6–20)
CO2: 19 mmol/L — ABNORMAL LOW (ref 22–32)
Calcium: 8.5 mg/dL — ABNORMAL LOW (ref 8.9–10.3)
Chloride: 107 mmol/L (ref 98–111)
Creatinine, Ser: 0.83 mg/dL (ref 0.44–1.00)
GFR, Estimated: >60 mL/min (ref >60)
Glucose, Bld: 105 mg/dL — ABNORMAL HIGH (ref 70–99)
Potassium: 3.4 mmol/L — ABNORMAL LOW (ref 3.5–5.1)
Sodium: 137 mmol/L (ref 135–145)

## 2024-02-20 LAB — CBC
HCT: 33.1 % — ABNORMAL LOW (ref 36.0–46.0)
Hemoglobin: 10.9 g/dL — ABNORMAL LOW (ref 12.0–15.0)
MCH: 29.5 pg (ref 26.0–34.0)
MCHC: 32.9 g/dL (ref 30.0–36.0)
MCV: 89.5 fL (ref 80.0–100.0)
Platelets: 354 K/uL (ref 150–400)
RBC: 3.7 MIL/uL — ABNORMAL LOW (ref 3.87–5.11)
RDW: 13.4 % (ref 11.5–15.5)
WBC: 10.1 K/uL (ref 4.0–10.5)
nRBC: 0 % (ref 0.0–0.2)

## 2024-02-20 LAB — D-DIMER, QUANTITATIVE: D-Dimer, Quant: 1.16 ug{FEU}/mL — ABNORMAL HIGH (ref 0.00–0.50)

## 2024-02-20 MED ORDER — KETOROLAC TROMETHAMINE 15 MG/ML IJ SOLN
15.0000 mg | Freq: Once | INTRAMUSCULAR | Status: AC
  Administered 2024-02-20: 15 mg via INTRAVENOUS
  Filled 2024-02-20: qty 1

## 2024-02-20 MED ORDER — IOHEXOL 350 MG/ML SOLN
100.0000 mL | Freq: Once | INTRAVENOUS | Status: AC | PRN
  Administered 2024-02-20: 100 mL via INTRAVENOUS

## 2024-02-20 NOTE — ED Provider Notes (Addendum)
 [facility] Provider Note   Event Date/Time   First MD Initiated Contact with Patient 02/20/24 2219     (approximate) History  Shortness of Breath  HPI Marie Jimenez is a 48 y.o. female with a past medical history of hypothyroidism and asthma with recent left hip surgery in February of this year who presents complaining of shortness of breath with associated centralized chest pain.  Patient also admits that she is having a lot of anxiety due to this pain and shortness of breath.  Patient is concerned that she has blood clots in her family however she was recently checked and this left lower extremity by ultrasound 3 weeks ago that did not find any abnormalities.  Patient continues to complain of left hip pain ROS: Patient currently denies any vision changes, tinnitus, difficulty speaking, facial droop, sore throat, abdominal pain, nausea/vomiting/diarrhea, dysuria, or weakness/numbness/paresthesias in any extremity   Physical Exam  Triage Vital Signs: ED Triage Vitals  Encounter Vitals Group     BP 02/20/24 2208 105/82     Girls Systolic BP Percentile --      Girls Diastolic BP Percentile --      Boys Systolic BP Percentile --      Boys Diastolic BP Percentile --      Pulse Rate 02/20/24 2208 (!) 131     Resp 02/20/24 2208 (!) 27     Temp 02/20/24 2208 98.3 F (36.8 C)     Temp src --      SpO2 02/20/24 2208 100 %     Weight --      Height --      Head Circumference --      Peak Flow --      Pain Score 02/20/24 2207 10     Pain Loc --      Pain Education --      Exclude from Growth Chart --    Most recent vital signs: Vitals:   02/20/24 2241 02/20/24 2256  BP: (!) 141/72   Pulse:  (!) 117  Resp:  (!) 24  Temp:    SpO2:  100%   General: Awake, oriented x4. CV:  Good peripheral perfusion. Resp:  Increased effort.  CTAB Abd:  No distention. Other:  Middle-aged overweight African-American female lying in stretcher in mild distress ED Results /  Procedures / Treatments  Labs (all labs ordered are listed, but only abnormal results are displayed) Labs Reviewed  BASIC METABOLIC PANEL WITH GFR - Abnormal; Notable for the following components:      Result Value   Potassium 3.4 (*)    CO2 19 (*)    Glucose, Bld 105 (*)    Calcium 8.5 (*)    All other components within normal limits  CBC - Abnormal; Notable for the following components:   RBC 3.70 (*)    Hemoglobin 10.9 (*)    HCT 33.1 (*)    All other components within normal limits  D-DIMER, QUANTITATIVE - Abnormal; Notable for the following components:   D-Dimer, Quant 1.16 (*)    All other components within normal limits   EKG ED ECG REPORT I, Artist MARLA Kerns, the attending physician, personally viewed and interpreted this ECG. Date: 02/20/2024 EKG Time: 2210 Rate: 131 Rhythm: Tachycardic sinus rhythm QRS Axis: normal Intervals: normal ST/T Wave abnormalities: normal Narrative Interpretation: Tachycardic sinus rhythm.  No evidence of acute ischemia RADIOLOGY ED MD interpretation: 2 view chest x-ray interpreted by me shows no evidence of  acute abnormalities including no pneumonia, pneumothorax, or widened mediastinum - All radiology independently interpreted and agree with radiology assessment Official radiology report(s): CT Angio Chest PE W/Cm &/Or Wo Cm Result Date: 02/20/2024 CLINICAL DATA:  Shortness of breath. EXAM: CT ANGIOGRAPHY CHEST WITH CONTRAST TECHNIQUE: Multidetector CT imaging of the chest was performed using the standard protocol during bolus administration of intravenous contrast. Multiplanar CT image reconstructions and MIPs were obtained to evaluate the vascular anatomy. RADIATION DOSE REDUCTION: This exam was performed according to the departmental dose-optimization program which includes automated exposure control, adjustment of the mA and/or kV according to patient size and/or use of iterative reconstruction technique. CONTRAST:  OMNIPAQUE  IOHEXOL   350 MG/ML SOLN COMPARISON:  September 03, 2020 FINDINGS: Cardiovascular: The segmental and subsegmental pulmonary arteries are limited in evaluation secondary to suboptimal opacification with intravenous contrast. No evidence of pulmonary embolism. Normal heart size. No pericardial effusion. Mediastinum/Nodes: No enlarged mediastinal, hilar, or axillary lymph nodes. The thyroid  gland is surgically absent. The trachea and esophagus demonstrate no significant findings. Lungs/Pleura: Lungs are clear. No pleural effusion or pneumothorax. Upper Abdomen: No acute abnormality. Musculoskeletal: No chest wall abnormality. No acute or significant osseous findings. Review of the MIP images confirms the above findings. IMPRESSION: 1. Limited evaluation of the segmental and subsegmental pulmonary arteries, as described above, without definite evidence of pulmonary embolism. 2. No evidence of pulmonary embolism or acute cardiopulmonary disease. Electronically Signed   By: Suzen Dials M.D.   On: 02/20/2024 23:21   DG Chest 2 View Result Date: 02/20/2024 CLINICAL DATA:  Short of breath EXAM: CHEST - 2 VIEW COMPARISON:  02/22/2022 FINDINGS: The heart size and mediastinal contours are within normal limits. Both lungs are clear. The visualized skeletal structures are unremarkable. IMPRESSION: No active cardiopulmonary disease. Electronically Signed   By: Ozell Daring M.D.   On: 02/20/2024 22:51   PROCEDURES: Critical Care performed: No Procedures MEDICATIONS ORDERED IN ED: Medications  ketorolac  (TORADOL ) 15 MG/ML injection 15 mg (15 mg Intravenous Given 02/20/24 2303)  iohexol  (OMNIPAQUE ) 350 MG/ML injection 100 mL (100 mLs Intravenous Contrast Given 02/20/24 2308)   IMPRESSION / MDM / ASSESSMENT AND PLAN / ED COURSE  I reviewed the triage vital signs and the nursing notes.                             The patient is on the cardiac monitor to evaluate for evidence of arrhythmia and/or significant heart rate  changes. Patient's presentation is most consistent with acute presentation with potential threat to life or bodily function. The patient is suffering from shortness of breath, but the immediate cause is not apparent.  Potential causes considered include, but are not limited to, asthma or COPD, congestive heart failure, pulmonary embolism, pneumothorax, coronary syndrome, pneumonia, and pleural effusion.  Despite the evaluation including history, exam, and testing, the cause of the shortness of breath remains unclear. However, during the ED stay, patient's condition improved, and at the time of discharge the shortness of breath is resolved, they are feeling well, and want to go home.  Patient will be discharged with strict return precautions and advice to follow up with primary MD within 24 hours for further evaluation.   FINAL CLINICAL IMPRESSION(S) / ED DIAGNOSES   Final diagnoses:  SOB (shortness of breath)  Sinus tachycardia   Rx / DC Orders   ED Discharge Orders     None      Note:  This document was prepared using Dragon voice recognition software and may include unintentional dictation errors.   Jossie Artist POUR, MD 02/20/24 7688    Matteus Mcnelly K, MD 02/20/24 [phone]

## 2024-02-20 NOTE — ED Triage Notes (Signed)
 Pt is coming in for shortness of breath  that she is attributing the the hip surgery she had in February. She mentions she has been seen for the continued pain in her left hip/leg that has a knot which was a concern for a blood clot but that was ruled out 3 weeks ago. She mentions today she had centralized chest pain, with the shortness of breath. She is otherwise stable at this time but has lots of anxiety in triage causing the tachypnea at this time.

## 2024-02-20 NOTE — ED Notes (Signed)
Bradler MD at bedside

## 2024-02-20 NOTE — ED Notes (Signed)
Rainbow sent to the lab at this time.  

## 2024-03-02 ENCOUNTER — Ambulatory Visit

## 2024-03-02 DIAGNOSIS — L501 Idiopathic urticaria: Secondary | ICD-10-CM

## 2024-03-16 ENCOUNTER — Ambulatory Visit (INDEPENDENT_AMBULATORY_CARE_PROVIDER_SITE_OTHER)

## 2024-03-16 DIAGNOSIS — L501 Idiopathic urticaria: Secondary | ICD-10-CM | POA: Diagnosis not present

## 2024-03-30 ENCOUNTER — Ambulatory Visit

## 2024-04-13 ENCOUNTER — Ambulatory Visit (INDEPENDENT_AMBULATORY_CARE_PROVIDER_SITE_OTHER)

## 2024-04-13 DIAGNOSIS — L501 Idiopathic urticaria: Secondary | ICD-10-CM

## 2024-04-16 NOTE — Progress Notes (Unsigned)
 Follow Up Note  RE: Marie Jimenez MRN: [id number] DOB: [birth year] Date of Office Visit: 04/17/2024  Referring provider: Rena Luke POUR, MD Primary care provider: Rena Luke POUR, MD  Chief Complaint: No chief complaint on file.  History of Present Illness: I had the pleasure of seeing Marie Jimenez for a follow up visit at the [facility] on 04/17/2024. She is a 48 y.o. female, who is being followed for CIU on Xolair , asthma, food allergy . Her previous allergy  office visit was on 10/13/2023 with Dr. Luke. Today is a regular follow up visit.  Discussed the use of AI scribe software for clinical note transcription with the patient, who gave verbal consent to proceed.  History of Present Illness            ***  Assessment and Plan: [name] is a 48 y.o. female with: Chronic idiopathic urticaria Well controlled with current regimen. Every 3-4 week regiment for Xolair  was ineffective in the past. Continue Xolair  300mg  every 2 weeks - given today.  If you have a flare:  Start allegra  (fexofenadine ) 180mg  twice a day. If symptoms are not controlled or causes drowsiness let us  know. Start Pepcid  (famotidine ) 20mg  twice a day.  May use hydroxyzine  25mg  every 8 hours as needed for itching. Avoid the following potential triggers: alcohol, tight clothing, NSAIDs, hot showers and getting overheated. Use triamcinolone  0.1% ointment twice a day as needed for rash flares. Do not use on the face, neck, armpits or groin area. Do not use more than 3 weeks in a row.    Mild intermittent asthma without complication Shortness of breath Past history - 2022 normal spirometry. Interim history - increased shortness of breath and using albuterol without much benefit. No calf tenderness/swelling.   Today's spirometry was unremarkable. Advised patient to follow up with PCP or surgeon regarding her shortness of breath due to recent surgical procedure to rule out possible PE. She has  been going to physical therapy and walking around more.   May use albuterol rescue inhaler 2 puffs every 4 to 6 hours as needed for shortness of breath, chest tightness, coughing, and wheezing. May use albuterol rescue inhaler 2 puffs 5 to 15 minutes prior to strenuous physical activities. Monitor frequency of use - if you need to use it more than twice per week on a consistent basis let us  know.    Food allergy  Past history - 2020 skin testing was positive to shellfish and mollusks. Continue strict avoidance of seafood. For mild symptoms you can take over the counter antihistamines such as Benadryl  1-2 tablets = 25-50mg  and monitor symptoms closely. If symptoms worsen or if you have severe symptoms including breathing issues, throat closure, significant swelling, whole body hives, severe diarrhea and vomiting, lightheadedness then inject epinephrine  and seek immediate medical care afterwards. Emergency action plan in place.  Assessment and Plan              No follow-ups on file.  No orders of the defined types were placed in this encounter.  Lab Orders  No laboratory test(s) ordered today    Diagnostics: Spirometry:  Tracings reviewed. Her effort: {Blank single:19197::Good reproducible efforts.,It was hard to get consistent efforts and there is a question as to whether this reflects a maximal maneuver.,Poor effort, data can not be interpreted.} FVC: ***L FEV1: ***L, ***% predicted FEV1/FVC ratio: ***% Interpretation: {Blank single:19197::Spirometry consistent with mild obstructive disease,Spirometry consistent with moderate obstructive disease,Spirometry consistent with severe obstructive disease,Spirometry consistent with  possible restrictive disease,Spirometry consistent with mixed obstructive and restrictive disease,Spirometry uninterpretable due to technique,Spirometry consistent with normal pattern,No overt abnormalities noted given today's efforts}.   Please see scanned spirometry results for details.  Skin Testing: {Blank single:19197::Select foods,Environmental allergy  panel,Environmental allergy  panel and select foods,Food allergy  panel,None,Deferred due to recent antihistamines use}. *** Results discussed with patient/family.   Medication List:  Current Outpatient Medications  Medication Sig Dispense Refill   albuterol (PROVENTIL HFA;VENTOLIN HFA) 108 (90 Base) MCG/ACT inhaler Inhale into the lungs.     cyclobenzaprine  (FLEXERIL ) 10 MG tablet Take 1 tablet (10 mg total) by mouth 2 (two) times daily as needed for muscle spasms. 10 tablet 0   EPINEPHrine  (EPIPEN  2-PAK) 0.3 mg/0.3 mL IJ SOAJ injection Inject 0.3 mg into the muscle as needed for anaphylaxis. 2 each 1   famotidine  (PEPCID ) 20 MG tablet Take 1 tablet (20 mg total) by mouth 2 (two) times daily. 60 tablet 2   fexofenadine  (ALLEGRA ) 180 MG tablet Take 1 tablet (180 mg total) by mouth in the morning and at bedtime. 60 tablet 2   hydrOXYzine  (ATARAX ) 25 MG tablet TAKE 1 TABLET(25 MG) BY MOUTH THREE TIMES DAILY AS NEEDED FOR ITCHING 90 tablet 1   levothyroxine (SYNTHROID, LEVOTHROID) 112 MCG tablet Take 112 mcg by mouth daily.     omalizumab  (XOLAIR ) 150 MG injection INJECT 300 MG UNDER THE SKIN EVERY 2 WEEKS (MAY REQUIRE RECONSTITUTION/DILUTION, REFER TO PACKAGE INSERT OR MEDICAL ORDER) 12 each 3   oxyCODONE (OXY IR/ROXICODONE) 5 MG immediate release tablet Take 1 tablet every 4 hours by oral route.     triamcinolone  ointment (KENALOG ) 0.1 % Apply 1 Application topically 2 (two) times daily as needed (rash flare). Do not use on the face, neck, armpits or groin area. Do not use more than 3 weeks in a row. 30 g 2   Current Facility-Administered Medications  Medication Dose Route Frequency Provider Last Rate Last Admin   omalizumab  (XOLAIR ) injection 300 mg  300 mg Subcutaneous Q14 Days Luke Orlan HERO, DO   300 mg at 04/13/24 1614   Allergies: Allergies  Allergen  Reactions   Amoxicillin Swelling   Shellfish Allergy  Swelling   I reviewed her past medical history, social history, family history, and environmental history and no significant changes have been reported from her previous visit.  Review of Systems  Constitutional:  Negative for appetite change, chills, fever and unexpected weight change.  HENT:  Negative for congestion and rhinorrhea.   Eyes:  Negative for itching.  Respiratory:  Positive for shortness of breath. Negative for cough, chest tightness and wheezing.   Gastrointestinal:  Negative for abdominal pain.  Skin:  Negative for rash.       pruritus  Neurological:  Negative for headaches.    Objective: There were no vitals taken for this visit. There is no height or weight on file to calculate BMI. Physical Exam Vitals and nursing note reviewed.  Constitutional:      Appearance: Normal appearance. She is well-developed.  HENT:     Head: Normocephalic and atraumatic.     Right Ear: Tympanic membrane and external ear normal.     Left Ear: Tympanic membrane and external ear normal.     Nose: Nose normal.     Mouth/Throat:     Mouth: Mucous membranes are moist.     Pharynx: Oropharynx is clear.  Eyes:     Conjunctiva/sclera: Conjunctivae normal.  Cardiovascular:     Rate and Rhythm: Normal rate and  regular rhythm.     Heart sounds: Normal heart sounds. No murmur heard. Pulmonary:     Effort: Pulmonary effort is normal.     Breath sounds: Normal breath sounds. No wheezing, rhonchi or rales.  Musculoskeletal:     Cervical back: Neck supple.     Right lower leg: No edema.     Left lower leg: No edema.  Skin:    General: Skin is warm.     Findings: No rash.  Neurological:     Mental Status: She is alert and oriented to person, place, and time.  Psychiatric:        Behavior: Behavior normal.    Previous notes and tests were reviewed. The plan was reviewed with the patient/family, and all questions/concerned were  addressed.  It was my pleasure to see Torra today and participate in her care. Please feel free to contact me with any questions or concerns.  Sincerely,  Orlan Cramp, DO Allergy  & Immunology  [facility]  [place] office: [phone] [facility] office: [phone]

## 2024-04-17 ENCOUNTER — Encounter: Payer: Self-pay | Admitting: Allergy

## 2024-04-17 ENCOUNTER — Other Ambulatory Visit: Payer: Self-pay

## 2024-04-17 ENCOUNTER — Ambulatory Visit: Admitting: Allergy

## 2024-04-17 VITALS — BP 118/78 | HR 75 | Temp 98.0°F | Resp 18 | Ht 70.0 in | Wt 242.2 lb

## 2024-04-17 DIAGNOSIS — L501 Idiopathic urticaria: Secondary | ICD-10-CM | POA: Diagnosis not present

## 2024-04-17 DIAGNOSIS — J4520 Mild intermittent asthma, uncomplicated: Secondary | ICD-10-CM

## 2024-04-17 DIAGNOSIS — T7800XD Anaphylactic reaction due to unspecified food, subsequent encounter: Secondary | ICD-10-CM | POA: Diagnosis not present

## 2024-04-17 DIAGNOSIS — T7800XA Anaphylactic reaction due to unspecified food, initial encounter: Secondary | ICD-10-CM

## 2024-04-17 MED ORDER — HYDROXYZINE HCL 25 MG PO TABS: 25.0000 mg | ORAL_TABLET | Freq: Three times a day (TID) | ORAL | 1 refills | Status: AC | PRN

## 2024-04-17 MED ORDER — ALBUTEROL SULFATE HFA 108 (90 BASE) MCG/ACT IN AERS: 2.0000 | INHALATION_SPRAY | RESPIRATORY_TRACT | 1 refills | Status: AC | PRN

## 2024-04-17 NOTE — Patient Instructions (Addendum)
 Hives: Continue Xolair  300mg  every 2 weeks.  If you have a flare:  Start allegra  (fexofenadine ) 180mg  twice a day. If symptoms are not controlled or causes drowsiness let us  know. Start pepcid  (famotidine ) 20mg  twice a day.  May use hydroxyzine  25mg  every 8 hours as needed for itching. Avoid the following potential triggers: alcohol, tight clothing, NSAIDs, hot showers and getting overheated. Use triamcinolone  0.1% ointment twice a day as needed for rash flares. Do not use on the face, neck, armpits or groin area. Do not use more than 3 weeks in a row.   Asthma May use albuterol rescue inhaler 2 puffs every 4 to 6 hours as needed for shortness of breath, chest tightness, coughing, and wheezing. May use albuterol rescue inhaler 2 puffs 5 to 15 minutes prior to strenuous physical activities. Monitor frequency of use - if you need to use it more than twice per week on a consistent basis let us  know.   Food allergy  Continue strict avoidance of seafood. For mild symptoms you can take over the counter antihistamines (zyrtec 10mg  to 20mg ) and monitor symptoms closely.  If symptoms worsen or if you have severe symptoms including breathing issues, throat closure, significant swelling, whole body hives, severe diarrhea and vomiting, lightheadedness then use epinephrine  and seek immediate medical care afterwards. Emergency action plan in place.  Follow up in 6 months or sooner if needed.

## 2024-04-18 ENCOUNTER — Telehealth: Payer: Self-pay | Admitting: *Deleted

## 2024-04-18 NOTE — Telephone Encounter (Signed)
 Called patient discussed change over to home admin. Patient advised she would prefer to continue injections in clinic at this time. I advised her if she changed her mind let me know and I will fax the required attestation to [facility] for home admin.

## 2024-04-27 ENCOUNTER — Ambulatory Visit (INDEPENDENT_AMBULATORY_CARE_PROVIDER_SITE_OTHER)

## 2024-04-27 DIAGNOSIS — L501 Idiopathic urticaria: Secondary | ICD-10-CM

## 2024-05-11 ENCOUNTER — Ambulatory Visit (INDEPENDENT_AMBULATORY_CARE_PROVIDER_SITE_OTHER)

## 2024-05-11 DIAGNOSIS — L501 Idiopathic urticaria: Secondary | ICD-10-CM | POA: Diagnosis not present

## 2024-05-25 ENCOUNTER — Ambulatory Visit (INDEPENDENT_AMBULATORY_CARE_PROVIDER_SITE_OTHER)

## 2024-05-25 DIAGNOSIS — L501 Idiopathic urticaria: Secondary | ICD-10-CM | POA: Diagnosis not present

## 2024-06-08 ENCOUNTER — Ambulatory Visit (INDEPENDENT_AMBULATORY_CARE_PROVIDER_SITE_OTHER): Admitting: *Deleted

## 2024-06-08 DIAGNOSIS — L501 Idiopathic urticaria: Secondary | ICD-10-CM

## 2024-06-22 ENCOUNTER — Ambulatory Visit

## 2024-06-22 DIAGNOSIS — L501 Idiopathic urticaria: Secondary | ICD-10-CM | POA: Diagnosis not present

## 2024-07-06 ENCOUNTER — Ambulatory Visit

## 2024-07-06 DIAGNOSIS — L501 Idiopathic urticaria: Secondary | ICD-10-CM

## 2024-07-21 ENCOUNTER — Ambulatory Visit: Admitting: *Deleted

## 2024-07-21 DIAGNOSIS — L501 Idiopathic urticaria: Secondary | ICD-10-CM

## 2024-08-03 ENCOUNTER — Ambulatory Visit

## 2024-08-03 DIAGNOSIS — L501 Idiopathic urticaria: Secondary | ICD-10-CM | POA: Diagnosis not present

## 2024-08-07 ENCOUNTER — Other Ambulatory Visit: Payer: Self-pay

## 2024-08-17 ENCOUNTER — Ambulatory Visit

## 2024-08-17 DIAGNOSIS — L501 Idiopathic urticaria: Secondary | ICD-10-CM

## 2024-08-31 ENCOUNTER — Ambulatory Visit

## 2024-10-02 ENCOUNTER — Ambulatory Visit: Admitting: Allergy
